# Patient Record
Sex: Male | Born: 1950 | Race: White | Hispanic: No | Marital: Married | Smoking: Never smoker
Health system: Southern US, Community
[De-identification: ages and names within clinical notes are randomized; demographics above are authoritative.]

## PROBLEM LIST (undated history)

## (undated) DIAGNOSIS — I2510 Atherosclerotic heart disease of native coronary artery without angina pectoris: Secondary | ICD-10-CM

## (undated) DIAGNOSIS — E785 Hyperlipidemia, unspecified: Secondary | ICD-10-CM

## (undated) DIAGNOSIS — Z951 Presence of aortocoronary bypass graft: Secondary | ICD-10-CM

## (undated) DIAGNOSIS — I10 Essential (primary) hypertension: Secondary | ICD-10-CM

## (undated) DIAGNOSIS — R9439 Abnormal result of other cardiovascular function study: Secondary | ICD-10-CM

## (undated) DIAGNOSIS — I219 Acute myocardial infarction, unspecified: Secondary | ICD-10-CM

## (undated) HISTORY — DX: Abnormal result of other cardiovascular function study: R94.39

## (undated) HISTORY — DX: Essential (primary) hypertension: I10

## (undated) HISTORY — DX: Atherosclerotic heart disease of native coronary artery without angina pectoris: I25.10

## (undated) HISTORY — DX: Hyperlipidemia, unspecified: E78.5

---

## 1999-04-02 ENCOUNTER — Ambulatory Visit (HOSPITAL_COMMUNITY): Admission: RE | Admit: 1999-04-02 | Discharge: 1999-04-02 | Payer: Self-pay | Admitting: Cardiology

## 1999-05-02 ENCOUNTER — Encounter: Payer: Self-pay | Admitting: Thoracic Surgery (Cardiothoracic Vascular Surgery)

## 1999-05-06 ENCOUNTER — Inpatient Hospital Stay (HOSPITAL_COMMUNITY)
Admission: RE | Admit: 1999-05-06 | Discharge: 1999-05-11 | Payer: Self-pay | Admitting: Thoracic Surgery (Cardiothoracic Vascular Surgery)

## 1999-05-06 ENCOUNTER — Encounter: Payer: Self-pay | Admitting: Thoracic Surgery (Cardiothoracic Vascular Surgery)

## 1999-05-06 HISTORY — PX: CORONARY ARTERY BYPASS GRAFT: SHX141

## 1999-05-07 ENCOUNTER — Encounter: Payer: Self-pay | Admitting: Thoracic Surgery (Cardiothoracic Vascular Surgery)

## 1999-05-08 ENCOUNTER — Encounter: Payer: Self-pay | Admitting: Thoracic Surgery (Cardiothoracic Vascular Surgery)

## 1999-05-10 ENCOUNTER — Encounter: Payer: Self-pay | Admitting: Thoracic Surgery (Cardiothoracic Vascular Surgery)

## 2003-04-03 ENCOUNTER — Ambulatory Visit (HOSPITAL_COMMUNITY): Admission: RE | Admit: 2003-04-03 | Discharge: 2003-04-03 | Payer: Self-pay | Admitting: *Deleted

## 2004-04-20 DIAGNOSIS — R9439 Abnormal result of other cardiovascular function study: Secondary | ICD-10-CM

## 2004-04-20 HISTORY — DX: Abnormal result of other cardiovascular function study: R94.39

## 2004-12-05 ENCOUNTER — Ambulatory Visit (HOSPITAL_COMMUNITY): Admission: RE | Admit: 2004-12-05 | Discharge: 2004-12-06 | Payer: Self-pay | Admitting: *Deleted

## 2004-12-05 HISTORY — PX: CORONARY ANGIOPLASTY WITH STENT PLACEMENT: SHX49

## 2006-03-29 ENCOUNTER — Ambulatory Visit (HOSPITAL_COMMUNITY): Admission: RE | Admit: 2006-03-29 | Discharge: 2006-03-29 | Payer: Self-pay | Admitting: *Deleted

## 2011-09-03 ENCOUNTER — Other Ambulatory Visit: Payer: Self-pay | Admitting: Internal Medicine

## 2011-09-11 ENCOUNTER — Ambulatory Visit
Admission: RE | Admit: 2011-09-11 | Discharge: 2011-09-11 | Disposition: A | Discharge status: Home / Self Care | Payer: 59 | Source: Ambulatory Visit | Attending: Internal Medicine | Admitting: Internal Medicine

## 2012-04-29 ENCOUNTER — Other Ambulatory Visit (HOSPITAL_COMMUNITY): Payer: Self-pay | Admitting: Cardiovascular Disease

## 2012-04-29 DIAGNOSIS — I2510 Atherosclerotic heart disease of native coronary artery without angina pectoris: Secondary | ICD-10-CM

## 2012-05-03 ENCOUNTER — Encounter: Payer: Self-pay | Admitting: *Deleted

## 2012-05-11 ENCOUNTER — Inpatient Hospital Stay (HOSPITAL_COMMUNITY): Admission: RE | Admit: 2012-05-11 | Payer: 59 | Source: Ambulatory Visit

## 2012-10-03 ENCOUNTER — Other Ambulatory Visit: Payer: Self-pay | Admitting: *Deleted

## 2012-10-03 MED ORDER — METOPROLOL TARTRATE 50 MG PO TABS: 75.0000 mg | ORAL_TABLET | Freq: Two times a day (BID) | ORAL | Status: DC

## 2012-11-01 ENCOUNTER — Other Ambulatory Visit: Payer: Self-pay | Admitting: *Deleted

## 2012-11-24 ENCOUNTER — Encounter: Payer: Self-pay | Admitting: Cardiovascular Disease

## 2012-11-24 ENCOUNTER — Ambulatory Visit (INDEPENDENT_AMBULATORY_CARE_PROVIDER_SITE_OTHER): Payer: 59 | Admitting: Cardiovascular Disease

## 2012-11-24 VITALS — BP 146/88 | HR 63 | Ht 69.0 in | Wt 180.8 lb

## 2012-11-24 DIAGNOSIS — I2510 Atherosclerotic heart disease of native coronary artery without angina pectoris: Secondary | ICD-10-CM

## 2012-11-24 DIAGNOSIS — E785 Hyperlipidemia, unspecified: Secondary | ICD-10-CM

## 2012-11-24 DIAGNOSIS — Z951 Presence of aortocoronary bypass graft: Secondary | ICD-10-CM | POA: Insufficient documentation

## 2012-11-24 DIAGNOSIS — I10 Essential (primary) hypertension: Secondary | ICD-10-CM

## 2012-11-24 NOTE — Assessment & Plan Note (Signed)
Status post coronary artery bypass grafting January 2001. He underwent cardiac catheterization by Dr. Charolette Child revealing patent grafts with the nongrafted RCA and high-grade RCA lesion which was stented by Dr. Lajuana Ripple with a drug-eluting stent (2.5 mm x 13 mm) 12/05/04. His last stress test was performed 05/16/10. He denies chest pain or shortness of breath. Since it's been almost 13 years since his bypass graft operation and close to 3 years since his last functional study am going to repeat his Myoview stress test to evaluate the status of his grafts.

## 2012-11-24 NOTE — Assessment & Plan Note (Signed)
Well-controlled on current medications 

## 2012-11-24 NOTE — Patient Instructions (Addendum)
  We will see you back in follow up in 1 year with Dr Berry  Dr Berry has ordered an exercise myoview   

## 2012-11-24 NOTE — Assessment & Plan Note (Signed)
On statin therapy. Followed by his primary care physician

## 2012-11-24 NOTE — Progress Notes (Signed)
11/24/2012 Kenneth Sanchez   1951/01/22  [id number]  Primary Physician Londell Moh, MD Primary Cardiologist: Runell Gess MD [name]   HPI:  The patient is a very pleasant 62 year old mildly overweight married Caucasian male, father of 3, grandfather to 1 grandchild, who works as a Estate agent at [employer]. I saw him a year ago. He has a history of CAD, status post coronary artery bypass grafting x4 in January 2001. He apparently had an abnormal Bruce-protocol exercise stress test with nonsustained ventricular tachycardia and was catheterized by Dr. Charolette Child revealing patent grafts with an ungrafted RCA and a high-grade RCA lesion, which was stented by Dr. Lenise Herald with a drug-eluting stent (2.5 x 13 mm) December 05, 2004. His other problems include hypertension and hyperlipidemia. He does complain of some abdominal discomfort radiating to his back but denies chest pain or shortness of breath. His most recent lab work, performed in May,wwas apparently favorable but we are in the process of obtaining those results. Since I saw him a year ago he denies chest pain or shortness of breath. I had wanted to get a Myoview stress test which was apparently denied by his insurance company. He does have full 39 -year-old grafts and therefore I'm going to obtain a  Myoview study to assess their status.   Current Outpatient Prescriptions  Medication Sig Dispense Refill  . aspirin 81 MG tablet Take 81 mg by mouth daily.      [name] atorvastatin (LIPITOR) 40 MG tablet Take 40 mg by mouth daily.      . clopidogrel (PLAVIX) 75 MG tablet Take 75 mg by mouth daily.      . fish oil-omega-3 fatty acids 1000 MG capsule Take 2,400 mg by mouth daily.       . folic acid (FOLVITE) 400 MCG tablet Take 400 mcg by mouth daily.      . metoprolol (LOPRESSOR) 50 MG tablet Take 1.5 tablets (75 mg total) by mouth 2 (two) times daily. Take 1 1/2 (one and one half) tablets twice a day  90  tablet  3  . Multiple Vitamin (MULTIVITAMIN) capsule Take 1 capsule by mouth daily.      . niacin (NIASPAN) 1000 MG CR tablet Take 2,000 mg by mouth at bedtime.      . pantoprazole (PROTONIX) 40 MG tablet Take 40 mg by mouth daily.      [name] NITROSTAT 0.4 MG SL tablet Take 0.4 mg by mouth as needed.       No current facility-administered medications for this visit.    No Known Allergies  History   Social History  . Marital Status: Married    Spouse Name: N/A    Number of Children: 3  . 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 Smoking status: Never Smoker   . Smokeless tobacco: Not on file  . Alcohol Use: Not on file  . Drug Use: Not on file  . Sexually Active: Not on file   Other Topics Concern  . Not on file   Social History Narrative  . No narrative on file     Review of Systems: General: negative for chills, fever, night sweats or weight changes.  Cardiovascular: negative for chest pain, dyspnea on exertion, edema, orthopnea, palpitations, paroxysmal nocturnal dyspnea or shortness of breath Dermatological: negative for rash Respiratory: negative for cough or wheezing Urologic: negative for hematuria Abdominal:  negative for nausea, vomiting, diarrhea, bright red blood per rectum, melena, or hematemesis Neurologic: negative for visual changes, syncope, or dizziness All other systems reviewed and are otherwise negative except as noted above.    Blood pressure 146/88, pulse 63, height 5\' 9"  (1.753 m), weight 180 lb 12.8 oz (82.01 kg).  General appearance: alert and no distress Neck: no adenopathy, no carotid bruit, no JVD, supple, symmetrical, trachea midline and thyroid not enlarged, symmetric, no tenderness/mass/nodules Lungs: clear to auscultation bilaterally Heart: regular rate and rhythm, S1, S2 normal, no murmur, click, rub or gallop Extremities: extremities normal, atraumatic, no cyanosis  or edema  EKG normal sinus rhythm at 63 without ST or T wave changes  ASSESSMENT AND PLAN:   Coronary artery disease Status post coronary artery bypass grafting January 2001. He underwent cardiac catheterization by Dr. Charolette Child revealing patent grafts with the nongrafted RCA and high-grade RCA lesion which was stented by Dr. Lajuana Ripple with a drug-eluting stent (2.5 mm x 13 mm) 12/05/04. His last stress test was performed 05/16/10. He denies chest pain or shortness of breath. Since it's been almost 13 years since his bypass graft operation and close to 3 years since his last functional study am going to repeat his Myoview stress test to evaluate the status of his grafts.  Hyperlipidemia On statin therapy. Followed by his primary care physician  Essential hypertension Well-controlled on current medications      Runell Gess MD [facility], [facility] 11/24/2012 9:21 AM

## 2012-12-13 ENCOUNTER — Ambulatory Visit (HOSPITAL_COMMUNITY)
Admission: RE | Admit: 2012-12-13 | Discharge: 2012-12-13 | Disposition: A | Discharge status: Home / Self Care | Payer: 59 | Source: Ambulatory Visit | Attending: Cardiology | Admitting: Cardiology

## 2012-12-13 DIAGNOSIS — I2510 Atherosclerotic heart disease of native coronary artery without angina pectoris: Secondary | ICD-10-CM

## 2012-12-13 MED ORDER — TECHNETIUM TC 99M SESTAMIBI GENERIC - CARDIOLITE
30.0000 | Freq: Once | INTRAVENOUS | Status: AC | PRN
  Administered 2012-12-13: 30 via INTRAVENOUS

## 2012-12-13 MED ORDER — TECHNETIUM TC 99M SESTAMIBI GENERIC - CARDIOLITE
10.0000 | Freq: Once | INTRAVENOUS | Status: AC | PRN
  Administered 2012-12-13: 10 via INTRAVENOUS

## 2012-12-13 NOTE — Procedures (Addendum)
[facility] [address] [place] [address] [phone]  Cardiology Nuclear Med Study  Kenneth Sanchez is a 62 y.o. male     MRN : [id number]     DOB: 1950-05-04  Procedure Date: 12/13/2012  Nuclear Med Background Indication for Stress Test:  Graft Patency and Stent Patency History:  CAD;CABG X4-04/1999;STENT/PTCA--12/05/2004 Cardiac Risk Factors: Family History - CAD, Hypertension, Lipids and Overweight  Symptoms:  Chest Pain   Nuclear Pre-Procedure Caffeine/Decaff Intake:  7:00pm NPO After: 5:00am   IV Site: R Hand  IV 0.9% NS with Angio Cath:  22g  Chest Size (in):  40"  IV Started by: Emmit Pomfret, RN  Height: 5\' 9"  (1.753 m)  Cup Size: n/a  BMI:  Body mass index is 26.57 kg/(m^2). Weight:  180 lb (81.647 kg)   Tech Comments:  N/A    Nuclear Med Study 1 or 2 day study: 1 day  Stress Test Type:  Stress  Order Authorizing Provider:  Nanetta Batty, MD   Resting Radionuclide: Technetium 18m Sestamibi  Resting Radionuclide Dose: 9.8 mCi   Stress Radionuclide:  Technetium 67m Sestamibi  Stress Radionuclide Dose: 30.0 mCi           Stress Protocol Rest HR: 133 Stress HR:  153  Rest BP:  188/99 Stress BP:  194/105  Exercise Time (min): 6 METS: 7.0   Predicted Max HR: 160 bpm % Max HR: 95.62 bpm Rate Pressure Product: 46962  Dose of Adenosine (mg):  n/a Dose of Lexiscan: n/a mg  Dose of Atropine (mg): n/a Dose of Dobutamine: n/a mcg/kg/min (at max HR)  Stress Test Technologist: Esperanza Sheets, CCT Nuclear Technologist: Koren Shiver, CNMT   Rest Procedure:  Myocardial perfusion imaging was performed at rest 45 minutes following the intravenous administration of Technetium 66m Sestamibi. Stress Procedure:  The patient performed treadmill exercise using a Bruce  Protocol for 6 minutes. The patient stopped due to Achieving target HR and denied any chest pain.  There were no significant ST-T wave changes.   Technetium 27m Sestamibi was injected at peak exercise and myocardial perfusion imaging was performed after a brief delay.  Transient Ischemic Dilatation (Normal <1.22):  0.85 Lung/Heart Ratio (Normal <0.45):  0.33 QGS EDV:  73 ml QGS ESV:  22 ml LV Ejection Fraction: 71%  Rest ECG: NSR - Normal EKG  Stress ECG: No significant ST segment change suggestive of ischemia.  QPS Raw Data Images:  Normal; no motion artifact; normal heart/lung ratio. Stress Images:  Normal homogeneous uptake in all areas of the myocardium. Rest Images:  Normal homogeneous uptake in all areas of the myocardium. Subtraction (SDS):  No evidence of ischemia.  Impression Exercise Capacity:  Fair exercise capacity. BP Response:  Hypotensive blood pressure response. Clinical Symptoms:  No significant symptoms noted. ECG Impression:  There are scattered PVCs. Comparison with Prior Nuclear Study: No significant change from previous study  Overall Impression:  Normal stress nuclear study.  LV Wall Motion:  NL LV Function; NL Wall Motion  Chrystie Nose, MD, [address] [facility] Board Certified in Nuclear Cardiology Attending Cardiologist [facility]  Chrystie Nose, MD  12/13/2012 10:46 AM

## 2012-12-15 ENCOUNTER — Encounter: Payer: Self-pay | Admitting: *Deleted

## 2013-02-02 ENCOUNTER — Other Ambulatory Visit: Payer: Self-pay | Admitting: Cardiovascular Disease

## 2013-02-02 NOTE — Telephone Encounter (Signed)
Rx was sent to pharmacy electronically. 

## 2013-03-04 ENCOUNTER — Other Ambulatory Visit: Payer: Self-pay | Admitting: Cardiovascular Disease

## 2013-08-01 ENCOUNTER — Telehealth: Payer: Self-pay | Admitting: *Deleted

## 2013-08-01 NOTE — Telephone Encounter (Signed)
Received fax requesting clearance to hold Plavix prior to dental extraction from Dr Filbert Bertholdobert Knox.  Dr Allyson SabalBerry reviewed the chart and gave permission to hold Plavix.  Form filled out and faxed back to Dr Lucky CowboyKnox

## 2013-12-07 ENCOUNTER — Ambulatory Visit: Payer: 59 | Admitting: Cardiovascular Disease

## 2013-12-29 ENCOUNTER — Ambulatory Visit (INDEPENDENT_AMBULATORY_CARE_PROVIDER_SITE_OTHER): Payer: Managed Care, Other (non HMO) | Admitting: Family Medicine

## 2013-12-29 VITALS — BP 126/76 | HR 64 | Temp 98.0°F | Resp 17 | Ht 69.0 in | Wt 188.0 lb

## 2013-12-29 DIAGNOSIS — M79645 Pain in left finger(s): Secondary | ICD-10-CM

## 2013-12-29 DIAGNOSIS — S61211A Laceration without foreign body of left index finger without damage to nail, initial encounter: Secondary | ICD-10-CM

## 2013-12-29 DIAGNOSIS — S61209A Unspecified open wound of unspecified finger without damage to nail, initial encounter: Secondary | ICD-10-CM

## 2013-12-29 DIAGNOSIS — M79609 Pain in unspecified limb: Secondary | ICD-10-CM

## 2013-12-29 NOTE — Progress Notes (Signed)
This is a 63 year old gentleman who comes in with his wife because he cut his left index finger on a hedge saw. This occurred this morning. He says he's up-to-date on his immunizations.  Patient has minimal pain but he was having trouble getting the bleeding to stop.   Objective: Left index finger has full range of motion. There is a laceration at the PIP joint is diagonal and full-thickness, measuring 1.2 cm. No numbness to the fingertip were distal to the laceration. He has full range of motion of the finger.  Assessment: Simple of thickness laceration of left index finger  Procedure note- VCO, patient denies allergies. Lidocaine 1% 3cc used for 2nd metacarpal block. Laceration cleaned with soap and water. 3cm laceration explored, no visible tendon. Horizontal mattress sutures x 2 placed. Wound care instructions provided. Return for removal in 10 days.   Signed, Sheila Oats.D.

## 2013-12-29 NOTE — Patient Instructions (Signed)
Sutured Wound Care °Sutures are stitches that can be used to close wounds. Wound care helps prevent pain and infection.  °HOME CARE INSTRUCTIONS  °· Rest and elevate the injured area until all the pain and swelling are gone. °· Only take over-the-counter or prescription medicines for pain, discomfort, or fever as directed by your caregiver. °· After 48 hours, gently wash the area with mild soap and water once a day, or as directed. Rinse off the soap. Pat the area dry with a clean towel. Do not rub the wound. This may cause bleeding. °· Follow your caregiver's instructions for how often to change the bandage (dressing). Stop using a dressing after 2 days or after the wound stops draining. °· If the dressing sticks, moisten it with soapy water and gently remove it. °· Apply ointment on the wound as directed. °· Avoid stretching a sutured wound. °· Drink enough fluids to keep your urine clear or pale yellow. °· Follow up with your caregiver for suture removal as directed. °· Use sunscreen on your wound for the next 3 to 6 months so the scar will not darken. °SEEK IMMEDIATE MEDICAL CARE IF:  °· Your wound becomes red, swollen, hot, or tender. °· You have increasing pain in the wound. °· You have a red streak that extends from the wound. °· There is pus coming from the wound. °· You have a fever. °· You have shaking chills. °· There is a bad smell coming from the wound. °· You have persistent bleeding from the wound. °MAKE SURE YOU:  °· Understand these instructions. °· Will watch your condition. °· Will get help right away if you are not doing well or get worse. °Document Released: 05/14/2004 Document Revised: 06/29/2011 Document Reviewed: 08/10/2010 °ExitCare® Patient Information ©2015 [employer]. This information is not intended to replace advice given to you by your health care provider. Make sure you discuss any questions you have with your health care provider. ° °

## 2014-01-08 ENCOUNTER — Ambulatory Visit (INDEPENDENT_AMBULATORY_CARE_PROVIDER_SITE_OTHER): Payer: Managed Care, Other (non HMO) | Admitting: Physician Assistant

## 2014-01-08 VITALS — BP 122/88 | HR 62 | Temp 97.8°F | Resp 18 | Ht 69.0 in | Wt 188.0 lb

## 2014-01-08 DIAGNOSIS — Z5189 Encounter for other specified aftercare: Secondary | ICD-10-CM

## 2014-01-08 DIAGNOSIS — S61211D Laceration without foreign body of left index finger without damage to nail, subsequent encounter: Secondary | ICD-10-CM

## 2014-01-08 NOTE — Patient Instructions (Signed)
Wash the wound daily with soap and water until completely healed.

## 2014-01-08 NOTE — Progress Notes (Signed)
   Subjective:    Patient ID: Kenneth Sanchez, male    DOB: 10-14-1950, 63 y.o.   MRN: [id number]   PCP: Londell Moh, MD  Chief Complaint  Patient presents with  . Suture / Staple Removal    HPI  Presents for removal of stitches placed 12/29/2013 to close a wound to the LEFT index finger. He's doing well without pain, swelling redness or drainage.  Review of Systems     Objective:   Physical Exam  BP 122/88  Pulse 62  Temp(Src) 97.8 F (36.6 C) (Oral)  Resp 18  Ht  (1.753 m)  Wt 188 lb (85.276 kg)  BMI 27.75 kg/m2  SpO2 97% WDWNWM, A&O x 3. #2 HM sutures intact. Wound is well healed. Sutures removed without incident.      Assessment & Plan:  1. Laceration of left index finger w/o foreign body w/o damage to nail, subsequent encounter Suture removal without incident.   Fernande Bras, PA-C Physician Assistant-Certified [facility]

## 2014-01-17 ENCOUNTER — Ambulatory Visit (INDEPENDENT_AMBULATORY_CARE_PROVIDER_SITE_OTHER): Payer: Managed Care, Other (non HMO) | Admitting: Cardiovascular Disease

## 2014-01-17 ENCOUNTER — Encounter: Payer: Self-pay | Admitting: Cardiovascular Disease

## 2014-01-17 VITALS — BP 134/90 | HR 61 | Ht 69.0 in | Wt 188.1 lb

## 2014-01-17 DIAGNOSIS — I2510 Atherosclerotic heart disease of native coronary artery without angina pectoris: Secondary | ICD-10-CM

## 2014-01-17 DIAGNOSIS — I10 Essential (primary) hypertension: Secondary | ICD-10-CM

## 2014-01-17 DIAGNOSIS — E785 Hyperlipidemia, unspecified: Secondary | ICD-10-CM

## 2014-01-17 NOTE — Patient Instructions (Signed)
Your physician wants you to follow-up in: 1 year with Dr Berry. You will receive a reminder letter in the mail two months in advance. If you don't receive a letter, please call our office to schedule the follow-up appointment.  

## 2014-01-17 NOTE — Assessment & Plan Note (Signed)
Controlled on current medications 

## 2014-01-17 NOTE — Assessment & Plan Note (Signed)
On statin therapy followed by his PCP. Recent blood work performed 10/03/13 revealed a total cholesterol of 114, LDL of 50 and HDL of 53.

## 2014-01-17 NOTE — Assessment & Plan Note (Signed)
History of CAD status post coronary artery bypass grafting times 04/24/1999 after abnormal Bruce protocol exercise stress test [facility] by nonsustained ventricular tachycardia. He had a stent placed in his arm graft to the RCA by Dr. Jenne CampusMcQueen 12/05/04 (2.5 mm x 13 mm). Myoview stress test performed 12/13/12 was nonischemic. He denies chest pain or shortness of breath.

## 2014-01-17 NOTE — Progress Notes (Signed)
01/17/2014 Kenneth BridgemanStanley J Craney   1950-05-18  [id number]  Primary Physician Londell MohPHARR,WALTER DAVIDSON, MD Primary Cardiologist: Runell GessJonathan J. Cesiah Westley MD Roseanne RenoFACP,FACC,FAHA, FSCAI   HPI:  The patient is a very pleasant 63 year old mildly overweight married Caucasian male, father of 3, grandfather to 3 grandchild, who works at  at [employer]. I saw him a year ago. He has a history of CAD, status post coronary artery bypass grafting x4 in January 2001. He apparently had an abnormal Bruce-protocol exercise stress test with nonsustained ventricular tachycardia and was catheterized by Dr. Charolette ChildJohn Tysinger revealing patent grafts with an ungrafted RCA and a high-grade RCA lesion, which was stented by Dr. Lenise Heraldobert McQueen with a drug-eluting stent (2.5 x 13 mm) December 05, 2004. His other problems include hypertension and hyperlipidemia. He does complain of some abdominal discomfort radiating to his back but denies chest pain or shortness of breath. His most recent lab work, performed in May,wwas apparently favorable but we are in the process of obtaining those results. I obtain a Myoview stress test on him last August which was normal.Since I saw him a year ago he denies chest pain or shortness of breath. Recent blood work performed by his primary care physician 10/03/13 revealed a total cholesterol of 114, LDL 50 and HDL of 53.  Current Outpatient Prescriptions  Medication Sig Dispense Refill  . aspirin 81 MG tablet Take 81 mg by mouth daily.      [name]. atorvastatin (LIPITOR) 40 MG tablet Take 40 mg by mouth daily.      . clopidogrel (PLAVIX) 75 MG tablet Take 75 mg by mouth daily.      . fish oil-omega-3 fatty acids 1000 MG capsule Take 2,400 mg by mouth daily.       . folic acid (FOLVITE) 400 MCG tablet Take 400 mcg by mouth daily.      . metoprolol (LOPRESSOR) 50 MG tablet TAKE 1 & 1/2 (ONE AND ONE- HALF) TABLETS BY MOUTH TWICE A DAY AS DIRECTED  90 tablet  8  . metoprolol (LOPRESSOR) 50 MG tablet TAKE 1 & 1/2 (ONE  AND ONE- HALF) TABLETS BY MOUTH TWICE A DAY AS DIRECTED  90 tablet  6  . Multiple Vitamin (MULTIVITAMIN) capsule Take 1 capsule by mouth daily.      . niacin (NIASPAN) 1000 MG CR tablet Take 2,000 mg by mouth at bedtime.      [name]. NITROSTAT 0.4 MG SL tablet Take 0.4 mg by mouth as needed.      . pantoprazole (PROTONIX) 40 MG tablet Take 40 mg by mouth daily.       No current facility-administered medications for this visit.    No Known Allergies  History   Social History  . Marital Status: Married    Spouse Name: N/A    Number of Children: 3  . 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 Smoking status: Never Smoker   . Smokeless tobacco: Not on file  . Alcohol Use: No  . Drug Use: No  . Sexual Activity: No   Other Topics Concern  . Not on file   Social History Narrative  . No narrative on file     Review of Systems: General: negative for chills, fever, night sweats or weight changes.  Cardiovascular: negative for chest pain, dyspnea on exertion, edema, orthopnea, palpitations, paroxysmal nocturnal dyspnea or shortness of breath Dermatological: negative for rash Respiratory:  negative for cough or wheezing Urologic: negative for hematuria Abdominal: negative for nausea, vomiting, diarrhea, bright red blood per rectum, melena, or hematemesis Neurologic: negative for visual changes, syncope, or dizziness All other systems reviewed and are otherwise negative except as noted above.    Blood pressure 134/90, pulse 61, height 5\' 9"  (1.753 m), weight 188 lb 1.6 oz (85.322 kg).  General appearance: alert and no distress Neck: no adenopathy, no carotid bruit, no JVD, supple, symmetrical, trachea midline and thyroid not enlarged, symmetric, no tenderness/mass/nodules Lungs: clear to auscultation bilaterally Heart: regular rate and rhythm, S1, S2 normal, no murmur, click, rub or gallop Extremities:  extremities normal, atraumatic, no cyanosis or edema  EKG normal sinus rhythm at 61 without ST or T wave changes  ASSESSMENT AND PLAN:   Essential hypertension Controlled on current medications  Hyperlipidemia On statin therapy followed by his PCP. Recent blood work performed 10/03/13 revealed a total cholesterol of 114, LDL of 50 and HDL of 53.  Coronary artery disease History of CAD status post coronary artery bypass grafting times 04/24/1999 after abnormal Bruce protocol exercise stress test [facility] by nonsustained ventricular tachycardia. He had a stent placed in his arm graft to the RCA by Dr. Jenne Campus 12/05/04 (2.5 mm x 13 mm). Myoview stress test performed 12/13/12 was nonischemic. He denies chest pain or shortness of breath.      Runell Gess MD FACP,FACC,FAHA, [facility] 01/17/2014 9:59 AM

## 2014-03-23 ENCOUNTER — Other Ambulatory Visit: Payer: Self-pay | Admitting: Cardiovascular Disease

## 2014-03-23 NOTE — Telephone Encounter (Signed)
Rx was sent to pharmacy electronically. 

## 2014-11-07 ENCOUNTER — Encounter: Payer: Self-pay | Admitting: Cardiovascular Disease

## 2014-11-16 ENCOUNTER — Encounter: Payer: Self-pay | Admitting: *Deleted

## 2014-12-14 ENCOUNTER — Other Ambulatory Visit: Payer: Self-pay | Admitting: Cardiovascular Disease

## 2014-12-14 NOTE — Telephone Encounter (Signed)
Rx request sent to pharmacy.  

## 2015-01-02 ENCOUNTER — Encounter: Payer: Self-pay | Admitting: Cardiovascular Disease

## 2015-01-08 ENCOUNTER — Ambulatory Visit: Payer: Managed Care, Other (non HMO) | Admitting: Cardiovascular Disease

## 2015-01-08 ENCOUNTER — Encounter: Payer: Self-pay | Admitting: Cardiovascular Disease

## 2015-01-08 ENCOUNTER — Ambulatory Visit (INDEPENDENT_AMBULATORY_CARE_PROVIDER_SITE_OTHER): Payer: Managed Care, Other (non HMO) | Admitting: Cardiovascular Disease

## 2015-01-08 VITALS — BP 142/82 | HR 59 | Ht 69.0 in | Wt 177.5 lb

## 2015-01-08 DIAGNOSIS — E785 Hyperlipidemia, unspecified: Secondary | ICD-10-CM

## 2015-01-08 DIAGNOSIS — I2510 Atherosclerotic heart disease of native coronary artery without angina pectoris: Secondary | ICD-10-CM

## 2015-01-08 DIAGNOSIS — I10 Essential (primary) hypertension: Secondary | ICD-10-CM

## 2015-01-08 DIAGNOSIS — I2583 Coronary atherosclerosis due to lipid rich plaque: Principal | ICD-10-CM

## 2015-01-08 NOTE — Assessment & Plan Note (Signed)
History of hypertension with blood pressure measured at 142/82. He is on metoprolol. Continue current meds at current dosing

## 2015-01-08 NOTE — Progress Notes (Signed)
01/08/2015 Kenneth Sanchez   06/27/1950  [id number]  Primary Physician Londell Moh, MD Primary Cardiologist: Runell Gess MD [name]   HPI:  The patient is a very pleasant 64 year old mildly overweight married Caucasian male, father of 3, grandfather to 3 grandchild, who works at at [employer]. I saw him a year ago. He has a history of CAD, status post coronary artery bypass grafting x4 in January 2001. He apparently had an abnormal Bruce-protocol exercise stress test with nonsustained ventricular tachycardia and was catheterized by Dr. Charolette Child revealing patent grafts with an ungrafted RCA and a high-grade RCA lesion, which was stented by Dr. Lenise Herald with a drug-eluting stent (2.5 x 13 mm) December 05, 2004. His other problems include hypertension and hyperlipidemia. He does complain of some abdominal discomfort radiating to his back but denies chest pain or shortness of breath. His most recent lab work, performed in May,wwas apparently favorable but we are in the process of obtaining those results. I obtain a Myoview stress test on him August 2014 which was normal.Since I saw him a year ago he denies chest pain or shortness of breath.    Current Outpatient Prescriptions  Medication Sig Dispense Refill  . aspirin 81 MG tablet Take 81 mg by mouth daily.    [name] atorvastatin (LIPITOR) 40 MG tablet Take 40 mg by mouth daily.    . clopidogrel (PLAVIX) 75 MG tablet Take 75 mg by mouth daily.    . folic acid (FOLVITE) 400 MCG tablet Take 400 mcg by mouth daily.    . metoprolol (LOPRESSOR) 50 MG tablet TAKE 1 & 1/2 (ONE AND ONE- HALF) TABLETS BY MOUTH TWICE A DAY AS DIRECTED 90 tablet 0  . Multiple Vitamin (MULTIVITAMIN) capsule Take 1 capsule by mouth daily.    . niacin (NIASPAN) 1000 MG CR tablet Take 2,000 mg by mouth at bedtime.    [name] NITROSTAT 0.4 MG SL tablet Take 0.4 mg by mouth every 5 (five) minutes as needed for chest pain (take up to 3 times  daily).     . Omega-3 Fatty Acids (FISH OIL) 600 MG CAPS Take 1,200 mg by mouth daily.     No current facility-administered medications for this visit.    No Known Allergies  Social History   Social History  . Marital Status: Married    Spouse Name: N/A  . Number of Children: 3  . 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 Smoking status: Never Smoker   . Smokeless tobacco: Not on file  . Alcohol Use: No  . Drug Use: No  . Sexual Activity: No   Other Topics Concern  . Not on file   Social History Narrative     Review of Systems: General: negative for chills, fever, night sweats or weight changes.  Cardiovascular: negative for chest pain, dyspnea on exertion, edema, orthopnea, palpitations, paroxysmal nocturnal dyspnea or shortness of breath Dermatological: negative for rash Respiratory: negative for cough or wheezing Urologic: negative for hematuria Abdominal: negative for nausea, vomiting, diarrhea, bright red blood per rectum, melena, or hematemesis Neurologic: negative for visual changes, syncope, or dizziness All other systems reviewed and are otherwise negative except as noted above.    Blood pressure 142/82, pulse 59, height  (1.753 m), weight 177 lb 8 oz (80.513 kg).  General appearance: alert and no distress Neck: no adenopathy, no carotid bruit,  no JVD, supple, symmetrical, trachea midline and thyroid not enlarged, symmetric, no tenderness/mass/nodules Lungs: clear to auscultation bilaterally Heart: regular rate and rhythm, S1, S2 normal, no murmur, click, rub or gallop Extremities: extremities normal, atraumatic, no cyanosis or edema Pulses: 2+ and symmetric Skin: Skin color, texture, turgor normal. No rashes or lesions Neurologic: Grossly normal  EKG sinus bradycardia 59 without ST or T-wave changes. I personally reviewed this EKG  ASSESSMENT AND PLAN:    Hyperlipidemia History of hyperlipidemia or atorvastatin 40 mg a day and fish oil followed by his PCP  Essential hypertension History of hypertension with blood pressure measured at 142/82. He is on metoprolol. Continue current meds at current dosing  Coronary artery disease History of coronary artery disease status post coronary artery bypass grafting January 2001 with 4 bypass grafts. He underwent mid native RCA intervention by Dr. Jenne Campus with a drug-eluting stent 12/05/04. He had a Myoview stress test performed August 2014 which was nonischemic. He denies chest pain or shortness of breath.      Runell Gess MD FACP,FACC,FAHA, [facility] 01/08/2015 9:26 AM

## 2015-01-08 NOTE — Patient Instructions (Signed)
Medication Instructions:  Your physician recommends that you continue on your current medications as directed. Please refer to the Current Medication list given to you today.   Labwork: NONE  Testing/Procedures: NONE  Follow-Up: Your physician wants you to follow-up in: 12 months with Dr. Berry. You will receive a reminder letter in the mail two months in advance. If you don't receive a letter, please call our office to schedule the follow-up appointment.   Any Other Special Instructions Will Be Listed Below (If Applicable).   

## 2015-01-08 NOTE — Assessment & Plan Note (Signed)
History of coronary artery disease status post coronary artery bypass grafting January 2001 with 4 bypass grafts. He underwent mid native RCA intervention by Dr. Jenne Campus with a drug-eluting stent 12/05/04. He had a Myoview stress test performed August 2014 which was nonischemic. He denies chest pain or shortness of breath.

## 2015-01-08 NOTE — Assessment & Plan Note (Signed)
History of hyperlipidemia or atorvastatin 40 mg a day and fish oil followed by his PCP

## 2015-02-09 ENCOUNTER — Other Ambulatory Visit: Payer: Self-pay | Admitting: Cardiovascular Disease

## 2015-02-11 ENCOUNTER — Other Ambulatory Visit: Payer: Self-pay | Admitting: Cardiovascular Disease

## 2015-02-19 ENCOUNTER — Ambulatory Visit: Payer: Managed Care, Other (non HMO) | Admitting: Cardiovascular Disease

## 2016-01-14 ENCOUNTER — Ambulatory Visit (INDEPENDENT_AMBULATORY_CARE_PROVIDER_SITE_OTHER): Payer: Managed Care, Other (non HMO) | Admitting: Cardiovascular Disease

## 2016-01-14 ENCOUNTER — Encounter: Payer: Self-pay | Admitting: Cardiovascular Disease

## 2016-01-14 ENCOUNTER — Telehealth: Payer: Self-pay | Admitting: Cardiovascular Disease

## 2016-01-14 DIAGNOSIS — I10 Essential (primary) hypertension: Secondary | ICD-10-CM | POA: Diagnosis not present

## 2016-01-14 DIAGNOSIS — E785 Hyperlipidemia, unspecified: Secondary | ICD-10-CM | POA: Diagnosis not present

## 2016-01-14 NOTE — Assessment & Plan Note (Signed)
History of CAD status post coronary artery bypass grafting X 24 April 1999. His RCA was stented by Dr. Jenne CampusMcQueen with a drug-eluting stent 12/05/2004. He denies chest pain or shortness of breath. Myoview stress test performed August 2014 was nonischemic.

## 2016-01-14 NOTE — Patient Instructions (Signed)
Medication Instructions:  NO CHANGES.   Follow-Up: Your physician wants you to follow-up in: 12 MONTHS WITH DR BERRY.  You will receive a reminder letter in the mail two months in advance. If you don't receive a letter, please call our office to schedule the follow-up appointment.   If you need a refill on your cardiac medications before your next appointment, please call your pharmacy.   

## 2016-01-14 NOTE — Telephone Encounter (Signed)
Pharmacies as listed in encounter added to patient's chart. Previous pharmacy removed.

## 2016-01-14 NOTE — Telephone Encounter (Signed)
Mr. Kenneth Sanchez noticed the pharmacy listed in his chart is incorrect. He'd like a correction made. I was unable to do it from my computer. For his daily medication, he uses [facility]. For any type of medication that needs to be picked up he uses [facility] on [address]. Please give him a call if you have questions or concerns.  Pt's ph# [phone]  Thank you.

## 2016-01-14 NOTE — Assessment & Plan Note (Signed)
History of hypertension with blood pressure measured today at 126/76. He is on metoprolol. Continue current meds at current dosing

## 2016-01-14 NOTE — Progress Notes (Signed)
01/14/2016 Kenneth Sanchez   20-Jan-1951  [id number]  Primary Physician Londell Moh, MD Primary Cardiologist: Runell Gess MD [name]  HPI:  The patient is a very pleasant 65 year old mildly overweight married Caucasian male, father of 3, grandfather to 3 grandchild, who works at at [employer]. I last saw him 01/08/15. He has a history of CAD, status post coronary artery bypass grafting x4 in January 2001. He apparently had an abnormal Bruce-protocol exercise stress test with nonsustained ventricular tachycardia and was catheterized by Dr. Charolette Child revealing patent grafts with an ungrafted RCA and a high-grade RCA lesion, which was stented by Dr. Lenise Herald with a drug-eluting stent (2.5 x 13 mm) December 05, 2004. His other problems include hypertension and hyperlipidemia. He does complain of some abdominal discomfort radiating to his back but denies chest pain or shortness of breath. His most recent lab work, performed in May,wwas apparently favorable but we are in the process of obtaining those results. I obtain a Myoview stress test on him August 2014 which was normal.Since I saw him a year ago he denies chest pain or shortness of breath.   Current Outpatient Prescriptions  Medication Sig Dispense Refill  . aspirin 81 MG tablet Take 81 mg by mouth daily.    [name] atorvastatin (LIPITOR) 40 MG tablet Take 40 mg by mouth daily.    . clopidogrel (PLAVIX) 75 MG tablet Take 75 mg by mouth daily.    . folic acid (FOLVITE) 400 MCG tablet Take 400 mcg by mouth daily.    . metoprolol (LOPRESSOR) 50 MG tablet TAKE 1 & 1/2 (ONE AND ONE- HALF) TABLETS BY MOUTH TWICE A DAY AS DIRECTED 90 tablet 10  . Multiple Vitamin (MULTIVITAMIN) capsule Take 1 capsule by mouth daily.    . niacin (NIASPAN) 1000 MG CR tablet Take 2,000 mg by mouth at bedtime.    [name] NITROSTAT 0.4 MG SL tablet Take 0.4 mg by mouth every 5 (five) minutes as needed for chest pain (take up to 3 times  daily).     . Omega-3 Fatty Acids (FISH OIL) 600 MG CAPS Take 1,200 mg by mouth daily.     No current facility-administered medications for this visit.     No Known Allergies  Social History   Social History  . Marital status: Married    Spouse name: N/A  . Number of children: 3  . Years of education: N/A   Occupational History  . fork lift operator [employer]   Social History Main Topics  . Smoking status: Never Smoker  . Smokeless tobacco: Not on file  . Alcohol use No  . Drug use: No  . Sexual activity: No   Other Topics Concern  . Not on file   Social History Narrative  . No narrative on file     Review of Systems: General: negative for chills, fever, night sweats or weight changes.  Cardiovascular: negative for chest pain, dyspnea on exertion, edema, orthopnea, palpitations, paroxysmal nocturnal dyspnea or shortness of breath Dermatological: negative for rash Respiratory: negative for cough or wheezing Urologic: negative for hematuria Abdominal: negative for nausea, vomiting, diarrhea, bright red blood per rectum, melena, or hematemesis Neurologic: negative for visual changes, syncope, or dizziness All other systems reviewed and are otherwise negative except as noted above.    Blood pressure 126/76, pulse 76, height 5\' 9"  (1.753 m), weight 188 lb (85.3 kg).  General appearance: alert and no distress Neck: no adenopathy,  no carotid bruit, no JVD, supple, symmetrical, trachea midline and thyroid not enlarged, symmetric, no tenderness/mass/nodules Lungs: clear to auscultation bilaterally Heart: regular rate and rhythm, S1, S2 normal, no murmur, click, rub or gallop Extremities: extremities normal, atraumatic, no cyanosis or edema  EKG normal sinus rhythm at 62 of her ST or T-wave changes. I personally reviewed this EKG  ASSESSMENT AND PLAN:   Essential hypertension History of hypertension with blood pressure measured today at 126/76. He is on metoprolol.  Continue current meds at current dosing  Hyperlipidemia History of CAD status post coronary artery bypass grafting X 24 April 1999. His RCA was stented by Dr. Jenne CampusMcQueen with a drug-eluting stent 12/05/2004. He denies chest pain or shortness of breath. Myoview stress test performed August 2014 was nonischemic.      Runell GessJonathan J. Avari Gelles MD FACP,FACC,FAHA, [facility] 01/14/2016 2:38 PM

## 2016-03-26 ENCOUNTER — Observation Stay (HOSPITAL_COMMUNITY)
Admission: EM | Admit: 2016-03-26 | Discharge: 2016-03-27 | Disposition: A | Discharge status: Home / Self Care | Payer: Managed Care, Other (non HMO) | Attending: Internal Medicine | Admitting: Internal Medicine

## 2016-03-26 ENCOUNTER — Encounter (HOSPITAL_COMMUNITY): Payer: Self-pay | Admitting: Emergency Medicine

## 2016-03-26 ENCOUNTER — Emergency Department (HOSPITAL_COMMUNITY): Payer: Managed Care, Other (non HMO)

## 2016-03-26 ENCOUNTER — Observation Stay (HOSPITAL_BASED_OUTPATIENT_CLINIC_OR_DEPARTMENT_OTHER): Payer: Managed Care, Other (non HMO)

## 2016-03-26 DIAGNOSIS — R55 Syncope and collapse: Secondary | ICD-10-CM | POA: Diagnosis not present

## 2016-03-26 DIAGNOSIS — Z7902 Long term (current) use of antithrombotics/antiplatelets: Secondary | ICD-10-CM | POA: Diagnosis not present

## 2016-03-26 DIAGNOSIS — I2583 Coronary atherosclerosis due to lipid rich plaque: Secondary | ICD-10-CM | POA: Diagnosis not present

## 2016-03-26 DIAGNOSIS — Z79899 Other long term (current) drug therapy: Secondary | ICD-10-CM | POA: Insufficient documentation

## 2016-03-26 DIAGNOSIS — Z955 Presence of coronary angioplasty implant and graft: Secondary | ICD-10-CM | POA: Diagnosis not present

## 2016-03-26 DIAGNOSIS — K219 Gastro-esophageal reflux disease without esophagitis: Secondary | ICD-10-CM | POA: Diagnosis not present

## 2016-03-26 DIAGNOSIS — S0101XA Laceration without foreign body of scalp, initial encounter: Secondary | ICD-10-CM | POA: Insufficient documentation

## 2016-03-26 DIAGNOSIS — E785 Hyperlipidemia, unspecified: Secondary | ICD-10-CM | POA: Diagnosis present

## 2016-03-26 DIAGNOSIS — S0003XA Contusion of scalp, initial encounter: Secondary | ICD-10-CM

## 2016-03-26 DIAGNOSIS — Z7982 Long term (current) use of aspirin: Secondary | ICD-10-CM | POA: Diagnosis not present

## 2016-03-26 DIAGNOSIS — W19XXXA Unspecified fall, initial encounter: Secondary | ICD-10-CM | POA: Diagnosis not present

## 2016-03-26 DIAGNOSIS — H6123 Impacted cerumen, bilateral: Secondary | ICD-10-CM | POA: Diagnosis not present

## 2016-03-26 DIAGNOSIS — I10 Essential (primary) hypertension: Secondary | ICD-10-CM | POA: Diagnosis present

## 2016-03-26 DIAGNOSIS — Z951 Presence of aortocoronary bypass graft: Secondary | ICD-10-CM | POA: Diagnosis present

## 2016-03-26 DIAGNOSIS — I2510 Atherosclerotic heart disease of native coronary artery without angina pectoris: Secondary | ICD-10-CM | POA: Diagnosis not present

## 2016-03-26 LAB — URINALYSIS, ROUTINE W REFLEX MICROSCOPIC
Bilirubin Urine: NEGATIVE
Glucose, UA: NEGATIVE mg/dL
Hgb urine dipstick: NEGATIVE
KETONES UR: 5 mg/dL — AB
LEUKOCYTES UA: NEGATIVE
NITRITE: NEGATIVE
PH: 7 (ref 5.0–8.0)
Protein, ur: NEGATIVE mg/dL
Specific Gravity, Urine: 1.01 (ref 1.005–1.030)

## 2016-03-26 LAB — BASIC METABOLIC PANEL
ANION GAP: 7 (ref 5–15)
BUN: 17 mg/dL (ref 6–20)
CO2: 28 mmol/L (ref 22–32)
Calcium: 9.1 mg/dL (ref 8.9–10.3)
Chloride: 101 mmol/L (ref 101–111)
Creatinine, Ser: 0.92 mg/dL (ref 0.61–1.24)
Glucose, Bld: 135 mg/dL — ABNORMAL HIGH (ref 65–99)
POTASSIUM: 4.6 mmol/L (ref 3.5–5.1)
SODIUM: 136 mmol/L (ref 135–145)

## 2016-03-26 LAB — CBC
HEMATOCRIT: 46.6 % (ref 39.0–52.0)
HEMOGLOBIN: 16.4 g/dL (ref 13.0–17.0)
MCH: 33 pg (ref 26.0–34.0)
MCHC: 35.2 g/dL (ref 30.0–36.0)
MCV: 93.8 fL (ref 78.0–100.0)
Platelets: 121 10*3/uL — ABNORMAL LOW (ref 150–400)
RBC: 4.97 MIL/uL (ref 4.22–5.81)
RDW: 12.6 % (ref 11.5–15.5)
WBC: 15 10*3/uL — AB (ref 4.0–10.5)

## 2016-03-26 LAB — TROPONIN I
Troponin I: <0.03 ng/mL (ref <0.03)
Troponin I: <0.03 ng/mL (ref <0.03)

## 2016-03-26 LAB — ECHOCARDIOGRAM COMPLETE
HEIGHTINCHES: 69 in
WEIGHTICAEL: 2974.4 [oz_av]

## 2016-03-26 LAB — CBG MONITORING, ED: Glucose-Capillary: 118 mg/dL — ABNORMAL HIGH (ref 65–99)

## 2016-03-26 MED ORDER — FISH OIL 600 MG PO CAPS: 2400.0000 mg | ORAL_CAPSULE | Freq: Every day | ORAL | Status: DC

## 2016-03-26 MED ORDER — OMEGA-3-ACID ETHYL ESTERS 1 G PO CAPS
2.0000 g | ORAL_CAPSULE | Freq: Every day | ORAL | Status: DC
  Administered 2016-03-27: 2 g via ORAL
  Filled 2016-03-26: qty 2

## 2016-03-26 MED ORDER — ADULT MULTIVITAMIN W/MINERALS CH
1.0000 | ORAL_TABLET | Freq: Every day | ORAL | Status: DC
  Administered 2016-03-26 – 2016-03-27 (×2): 1 via ORAL
  Filled 2016-03-26 (×2): qty 1

## 2016-03-26 MED ORDER — LORATADINE 10 MG PO TABS
10.0000 mg | ORAL_TABLET | Freq: Every day | ORAL | Status: DC
  Administered 2016-03-26 – 2016-03-27 (×2): 10 mg via ORAL
  Filled 2016-03-26 (×2): qty 1

## 2016-03-26 MED ORDER — VITAMIN C 500 MG PO TABS
1000.0000 mg | ORAL_TABLET | Freq: Every day | ORAL | Status: DC
  Administered 2016-03-26 – 2016-03-27 (×2): 1000 mg via ORAL
  Filled 2016-03-26 (×2): qty 2

## 2016-03-26 MED ORDER — NIACIN ER 500 MG PO CPCR
2000.0000 mg | ORAL_CAPSULE | Freq: Every day | ORAL | Status: DC
  Administered 2016-03-26: 2000 mg via ORAL
  Filled 2016-03-26: qty 4

## 2016-03-26 MED ORDER — ENOXAPARIN SODIUM 40 MG/0.4ML ~~LOC~~ SOLN
40.0000 mg | SUBCUTANEOUS | Status: DC
  Administered 2016-03-26: 40 mg via SUBCUTANEOUS
  Filled 2016-03-26: qty 0.4

## 2016-03-26 MED ORDER — NIACIN ER (ANTIHYPERLIPIDEMIC) 1000 MG PO TBCR: 2000.0000 mg | EXTENDED_RELEASE_TABLET | Freq: Every day | ORAL | Status: DC

## 2016-03-26 MED ORDER — SODIUM CHLORIDE 0.9 % IV SOLN: INTRAVENOUS | Status: DC

## 2016-03-26 MED ORDER — ATORVASTATIN CALCIUM 40 MG PO TABS
40.0000 mg | ORAL_TABLET | Freq: Every day | ORAL | Status: DC
  Administered 2016-03-26: 40 mg via ORAL
  Filled 2016-03-26: qty 1

## 2016-03-26 MED ORDER — FOLIC ACID 1 MG PO TABS
500.0000 ug | ORAL_TABLET | Freq: Every evening | ORAL | Status: DC
  Administered 2016-03-26: 0.5 mg via ORAL
  Filled 2016-03-26: qty 1

## 2016-03-26 MED ORDER — TETANUS-DIPHTH-ACELL PERTUSSIS 5-2.5-18.5 LF-MCG/0.5 IM SUSP
0.5000 mL | Freq: Once | INTRAMUSCULAR | Status: AC
  Administered 2016-03-26: 0.5 mL via INTRAMUSCULAR
  Filled 2016-03-26: qty 0.5

## 2016-03-26 MED ORDER — SODIUM CHLORIDE 0.9 % IV SOLN
INTRAVENOUS | Status: DC
  Administered 2016-03-26 – 2016-03-27 (×2): via INTRAVENOUS

## 2016-03-26 MED ORDER — METOPROLOL TARTRATE 50 MG PO TABS
50.0000 mg | ORAL_TABLET | Freq: Two times a day (BID) | ORAL | Status: DC
  Administered 2016-03-26 – 2016-03-27 (×3): 50 mg via ORAL
  Filled 2016-03-26 (×3): qty 1

## 2016-03-26 MED ORDER — CLOPIDOGREL BISULFATE 75 MG PO TABS
75.0000 mg | ORAL_TABLET | Freq: Every day | ORAL | Status: DC
  Administered 2016-03-26 – 2016-03-27 (×2): 75 mg via ORAL
  Filled 2016-03-26 (×2): qty 1

## 2016-03-26 MED ORDER — SODIUM CHLORIDE 0.9% FLUSH: 3.0000 mL | Freq: Two times a day (BID) | INTRAVENOUS | Status: DC

## 2016-03-26 MED ORDER — MULTIVITAMINS PO CAPS: 1.0000 | ORAL_CAPSULE | Freq: Every day | ORAL | Status: DC

## 2016-03-26 MED ORDER — GUAIFENESIN ER 600 MG PO TB12
600.0000 mg | ORAL_TABLET | Freq: Two times a day (BID) | ORAL | Status: DC
  Administered 2016-03-26 – 2016-03-27 (×3): 600 mg via ORAL
  Filled 2016-03-26 (×3): qty 1

## 2016-03-26 MED ORDER — ASPIRIN EC 81 MG PO TBEC
81.0000 mg | DELAYED_RELEASE_TABLET | Freq: Every evening | ORAL | Status: DC
  Administered 2016-03-26: 81 mg via ORAL
  Filled 2016-03-26: qty 1

## 2016-03-26 MED ORDER — PANTOPRAZOLE SODIUM 40 MG PO TBEC
40.0000 mg | DELAYED_RELEASE_TABLET | Freq: Every day | ORAL | Status: DC
  Administered 2016-03-26 – 2016-03-27 (×2): 40 mg via ORAL
  Filled 2016-03-26 (×2): qty 1

## 2016-03-26 NOTE — ED Notes (Signed)
ED Provider at bedside. EDP LITTLE PRESENT TO UPDATE PT RESULTS WITH PT AND WIFE

## 2016-03-26 NOTE — ED Triage Notes (Addendum)
Pt c/o syncope and fall today onto tile floor at about 0545 today. Does not remember fall. Wife reports pt was trying to get up when she found him, pt does not recall this. Laceration to posterior head. No anticoagulants. Plavix and aspirin for antiplatelets. Pt reports he has had hoarseness, sore throat, cough, sneezing, sinus congestion onset Saturday. Was taking Mucinex, HPB for cold.

## 2016-03-26 NOTE — Progress Notes (Signed)
CSW spoke with patient at bedside with wife present Kenneth ManeDonna Reifschneider. Patient reports he has a cold and he does not remember the fall. Patient reports he had to use the bathroom and he got up off of the commode. Patient reports he does not remember getting up and he states next he knew he was on the couch beside his wife. Patient reports his wife informed him that he had fallen. Patient reports he fell on the kitchen tile floor. Patient reports no history of falls nor any use of medical equipment. No questions noted for CSW at this time.   Posey ReaLaVonia Pritika Alvarez, LCSWA Clinical Social Worker [phone] 11:57 AM

## 2016-03-26 NOTE — ED Notes (Signed)
Patient transported to CT 

## 2016-03-26 NOTE — ED Notes (Signed)
SOCIAL WORK PRESENT TO SPEAK WITH PT AND WIFE

## 2016-03-26 NOTE — H&P (Signed)
History and Physical        Hospital Admission Note Date: 03/26/2016  Patient name: Kenneth BridgemanStanley J Sanchez Medical record number: [id number] Date of birth: 21-Oct-1950 Age: 65 y.o. Gender: male  PCP: Londell MohPHARR,WALTER DAVIDSON, MD   Referring physician: Dr Clarene DukeLittle  Patient coming from: home    Chief Complaint:  Passed out at home   HPI: Patient is a 65 year old male with history of CAD status post CABG, hypertension, hyperlipidemia presented from home with a syncopal episode today. History was obtained from patient and his wife. The patient reported that he woke up around 5:45 AM this morning, went to the bathroom, was straining on his toilet for BM, subsequently flushed his toilet and was walking out of the bathroom. Next thing he remembers was being on the floor found by his wife. The patient's wife reported loud thud of his fall, patient struck the back of his head. Patient denied any chest pain, palpitation, dizziness or lightheadedness. Patient reported that he has been having sore throat, mild coughing, nasal congestion and URI symptoms for the last 3 days. He had been taking Mucinex, Coricidin (acetaminophen and chlorpheniramine) for his symptoms. He denied any blurry vision, focal neurological deficits. Per patient's wife, he was out for less than 2 minutes. She did not notice any seizure-like activity or focal neurological deficits. ED work-up/course:  Temp 98.3, respiratory rate 16, pulse 74, BP 155/81, orthostatic vitals negative CBC, BMET unremarkable, except WBCs 15.0 UA with ketones  CT head with no evidence of acute intracranial or cervical spine injury. CT C-spine showed large left parietal scalp hematoma with laceration, no fracture. Chest x-ray with no active cardiopulmonary disease. EKG with normal sinus rhythm, rate 75, no acute ST-T wave changes suggestive of  ischemia   Review of Systems: Positives marked in 'bold' Constitutional: Denies fever, chills, diaphoresis, poor appetite and fatigue.  HEENT: Denies photophobia, eye pain, redness, hearing loss, ear pain, congestion, sore throat, rhinorrhea, sneezing, mouth sores, trouble swallowing, neck pain, neck stiffness and tinnitus.   Respiratory: Denies SOB, DOE, cough, chest tightness,  and wheezing.   Cardiovascular: Denies chest pain, palpitations and leg swelling.  Gastrointestinal: Denies nausea, vomiting, abdominal pain, diarrhea, constipation, blood in stool and abdominal distention.  Genitourinary: Denies dysuria, urgency, frequency, hematuria, flank pain and difficulty urinating.  Musculoskeletal: Denies myalgias, back pain, joint swelling, arthralgias and gait problem.  Skin: Denies pallor, rash and wound.  Neurological: Denies seizures, weakness, numbness and headaches.  please see history of present illness  Hematological: Denies adenopathy. Easy bruising, personal or family bleeding history  Psychiatric/Behavioral: Denies suicidal ideation, mood changes, confusion, nervousness, sleep disturbance and agitation  Past Medical History: Past Medical History:  Diagnosis Date  . Abnormal stress test 2006   NSVT; had cardiac cath-12/05/04  . CAD (coronary artery disease)    CABG x4- 05/06/99; cath with PCI 12/05/2004  . Hyperlipemia   . Hypertension     Past Surgical History:  Procedure Laterality Date  . CORONARY ANGIOPLASTY WITH STENT PLACEMENT  12/05/2004   cypher stent [id number] to mid RCA, inflation to 14 atmospheres  . CORONARY ARTERY BYPASS GRAFT  05/06/99   x4; LIMA to distal LAD; RIMA to  distal RCA, SVG to first diag branch, SVG to circ marginal branch    Medications: Prior to Admission medications   Medication Sig Start Date End Date Taking? Authorizing Provider  Ascorbic Acid (VITAMIN C) 1000 MG tablet Take 1,000 mg by mouth daily.   Yes Historical Provider, MD  aspirin 81  MG tablet Take 81 mg by mouth every evening.    Yes Historical Provider, MD  atorvastatin (LIPITOR) 40 MG tablet Take 40 mg by mouth at bedtime.    Yes Historical Provider, MD  clopidogrel (PLAVIX) 75 MG tablet Take 75 mg by mouth daily.   Yes Historical Provider, MD  Dextromethorphan-Guaifenesin (CORICIDIN HBP CONGESTION/COUGH PO) Take 2 tablets by mouth every 6 (six) hours as needed (cold symptoms).   Yes Historical Provider, MD  esomeprazole (NEXIUM) 40 MG capsule Take 40 mg by mouth at bedtime.   Yes Historical Provider, MD  folic acid (FOLVITE) 400 MCG tablet Take 400 mcg by mouth every evening.    Yes Historical Provider, MD  metoprolol (LOPRESSOR) 50 MG tablet TAKE 1 & 1/2 (ONE AND ONE- HALF) TABLETS BY MOUTH TWICE A DAY AS DIRECTED Patient taking differently: TAKE 1 & 1/2 (ONE AND ONE- HALF)= 75mg  TABLETS BY MOUTH TWICE A DAY AS DIRECTED 02/11/15  Yes Runell GessJonathan J Berry, MD  Multiple Vitamin (MULTIVITAMIN) capsule Take 1 capsule by mouth daily.   Yes Historical Provider, MD  niacin (NIASPAN) 1000 MG CR tablet Take 2,000 mg by mouth at bedtime.   Yes Historical Provider, MD  NITROSTAT 0.4 MG SL tablet Take 0.4 mg by mouth every 5 (five) minutes as needed for chest pain (take up to 3 times daily).  09/13/12  Yes Historical Provider, MD  Omega-3 Fatty Acids (FISH OIL) 600 MG CAPS Take 2,400 mg by mouth daily.    Yes Historical Provider, MD  ranitidine (ZANTAC) 150 MG tablet Take 150 mg by mouth daily.   Yes Historical Provider, MD    Allergies:  No Known Allergies  Social History:  reports that he has never smoked. He does not have any smokeless tobacco history on file. He reports that he does not drink alcohol or use drugs.  Family History: Family History  Problem Relation Age of Onset  . Heart disease Mother 3160  . COPD Father     Physical Exam: Blood pressure 118/78, pulse 70, temperature 98 F (36.7 C), temperature source Oral, resp. rate 12, height 5\' 9"  (1.753 m), weight 85.7 kg  (189 lb), SpO2 96 %. General: Alert, awake, oriented x3, in no acute distress. HEENT: normocephalic, atraumatic, anicteric sclera, pink conjunctiva, pupils equal and reactive to light and accomodation, oropharynx clear, Dried blood on the back of the head  Neck: supple, no masses or lymphadenopathy, no goiter, no bruits  Heart: Regular rate and rhythm, without murmurs, rubs or gallops. Lungs: Clear to auscultation bilaterally, no wheezing, rales or rhonchi. Abdomen: Soft, nontender, nondistended, positive bowel sounds, no masses. Extremities: No clubbing, cyanosis or edema with positive pedal pulses. Neuro: Grossly intact, no focal neurological deficits, strength 5/5 upper and lower extremities bilaterally Psych: alert and oriented x 3, normal mood and affect Skin: no rashes or lesions, warm and dry   LABS on Admission:  Basic Metabolic Panel:  Recent Labs Lab 03/26/16 0847  NA 136  K 4.6  CL 101  CO2 28  GLUCOSE 135*  BUN 17  CREATININE 0.92  CALCIUM 9.1   Liver Function Tests: No results for input(s): AST, ALT, ALKPHOS, BILITOT, PROT, ALBUMIN  in the last 168 hours. No results for input(s): LIPASE, AMYLASE in the last 168 hours. No results for input(s): AMMONIA in the last 168 hours. CBC:  Recent Labs Lab 03/26/16 0847  WBC 15.0*  HGB 16.4  HCT 46.6  MCV 93.8  PLT 121*   Cardiac Enzymes: No results for input(s): CKTOTAL, CKMB, CKMBINDEX, TROPONINI in the last 168 hours. BNP: Invalid input(s): POCBNP CBG:  Recent Labs Lab 03/26/16 0853  GLUCAP 118*    Radiological Exams on Admission:  No results found.  *I have personally reviewed the images above*  EKG: Independently reviewed. EKG with normal sinus rhythm, rate 75, no acute ST-T wave changes suggestive of ischemia    Assessment/Plan Principal Problem:   Syncope possibly vasovagal versus due to medication effect from chlorpheniramine or recent URI  -  admit for observation, telemetry, obtain serial  cardiac enzymes  - Obtain 2-D echocardiogram, orthostatic vitals negative - UA with ketones, place on gentle hydration   Active Problems:   Essential hypertensionContinue -  continue metoprolol     Hyperlipidemia - Continue Lipitor     Coronary artery disease - Currently stable, EKG with no ST-T wave changes  - Continue aspirin, Plavix, statin, beta blocker    GERD - Continue PPI  DVT prophylaxis:  Lovenox   CODE STATUS:  full CODE STATUS   Consults called: none   Family Communication: Admission, patients condition and plan of care including tests being ordered have been discussed with the patient and wife  who indicates understanding and agree with the plan and Code Status  Admission status: obs   Disposition plan: Further plan will depend as patient's clinical course evolves and further radiologic and laboratory data become available. Likely home    At the time of admission, it appears that the appropriate admission status for this patient is observation . This is judged to be reasonable and necessary in order to provide the required intensity of service to ensure the patient's safety given the presenting symptoms, physical exam findings, and initial radiographic and laboratory data in the context of their chronic comorbidities.    Time Spent on Admission:   Frayda Egley M.D. [facility] 03/26/2016, 12:26 PM Pager: [phone]  If 7PM-7AM, please contact night-coverage [email or web address] Password [email or web address]

## 2016-03-26 NOTE — ED Provider Notes (Signed)
[facility]-EMERGENCY DEPT Provider Note   CSN: [id number] Arrival date & time: 03/26/16  0818     History   Chief Complaint No chief complaint on file.   HPI Kenneth Sanchez is a 65 y.o. male.  65 year old male with past medical history including CAD s/p CABG, HTN, HLD who p/w syncope and head injury. This morning, the patient walked to the bathroom and sat on the toilet. He notes that he was bearing down like he needed to have a bowel movement. He remembers standing up and flushing the toilet and the next thing he remembers is being on the ground just outside of the bathroom, found by his wife on the floor. He struck the back of his head. He denies any chest pain or shortness of breath surrounding the events. This is never happened before. Other than the pain in the back of his head he denies any other injuries. He has been sick for last 5 days with hoarseness, sore throat, mild cough, and nasal congestion. He denies any shortness of breath from the cough. No fevers. He has been eating and drinking normally.   The history is provided by the patient.    Past Medical History:  Diagnosis Date  . Abnormal stress test 2006   NSVT; had cardiac cath-12/05/04  . CAD (coronary artery disease)    CABG x4- 05/06/99; cath with PCI 12/05/2004  . Hyperlipemia   . Hypertension     Patient Active Problem List   Diagnosis Date Noted  . Syncope 03/26/2016  . Essential hypertension 11/24/2012  . Hyperlipidemia 11/24/2012  . Coronary artery disease 11/24/2012    Past Surgical History:  Procedure Laterality Date  . CORONARY ANGIOPLASTY WITH STENT PLACEMENT  12/05/2004   cypher stent 2.5x85mm to mid RCA, inflation to 14 atmospheres  . CORONARY ARTERY BYPASS GRAFT  05/06/99   x4; LIMA to distal LAD; RIMA to distal RCA, SVG to first diag branch, SVG to circ marginal branch       Home Medications    Prior to Admission medications   Medication Sig Start Date End Date Taking? Authorizing Provider    Ascorbic Acid (VITAMIN C) 1000 MG tablet Take 1,000 mg by mouth daily.   Yes Historical Provider, MD  aspirin 81 MG tablet Take 81 mg by mouth every evening.    Yes Historical Provider, MD  atorvastatin (LIPITOR) 40 MG tablet Take 40 mg by mouth at bedtime.    Yes Historical Provider, MD  clopidogrel (PLAVIX) 75 MG tablet Take 75 mg by mouth daily.   Yes Historical Provider, MD  Dextromethorphan-Guaifenesin (CORICIDIN HBP CONGESTION/COUGH PO) Take 2 tablets by mouth every 6 (six) hours as needed (cold symptoms).   Yes Historical Provider, MD  esomeprazole (NEXIUM) 40 MG capsule Take 40 mg by mouth at bedtime.   Yes Historical Provider, MD  folic acid (FOLVITE) 400 MCG tablet Take 400 mcg by mouth every evening.    Yes Historical Provider, MD  metoprolol (LOPRESSOR) 50 MG tablet TAKE 1 & 1/2 (ONE AND ONE- HALF) TABLETS BY MOUTH TWICE A DAY AS DIRECTED Patient taking differently: TAKE 1 & 1/2 (ONE AND ONE- HALF)= 75mg  TABLETS BY MOUTH TWICE A DAY AS DIRECTED 02/11/15  Yes Runell Gess, MD  Multiple Vitamin (MULTIVITAMIN) capsule Take 1 capsule by mouth daily.   Yes Historical Provider, MD  niacin (NIASPAN) 1000 MG CR tablet Take 2,000 mg by mouth at bedtime.   Yes Historical Provider, MD  NITROSTAT 0.4 MG SL tablet Take  0.4 mg by mouth every 5 (five) minutes as needed for chest pain (take up to 3 times daily).  09/13/12  Yes Historical Provider, MD  Omega-3 Fatty Acids (FISH OIL) 600 MG CAPS Take 2,400 mg by mouth daily.    Yes Historical Provider, MD  ranitidine (ZANTAC) 150 MG tablet Take 150 mg by mouth daily.   Yes Historical Provider, MD    Family History Family History  Problem Relation Age of Onset  . Heart disease Mother 86  . COPD Father     Social History Social History  Substance Use Topics  . Smoking status: Never Smoker  . Smokeless tobacco: Not on file  . Alcohol use No     Allergies   Patient has no known allergies.   Review of Systems Review of Systems 10  Systems reviewed and are negative for acute change except as noted in the HPI.   Physical Exam Updated Vital Signs BP 125/70   Pulse 75   Temp 98 F (36.7 C) (Oral)   Resp 17   Ht 5\' 9"  (1.753 m)   Wt 189 lb (85.7 kg)   SpO2 100%   BMI 27.91 kg/m   Physical Exam  Constitutional: He is oriented to person, place, and time. He appears well-developed and well-nourished. No distress.  Awake, alert  HENT:  Head: Normocephalic.  B/l cerumen impaction Large hematoma posterior scalp with overlying abrasion  Eyes: Conjunctivae and EOM are normal. Pupils are equal, round, and reactive to light.  Neck: Neck supple.  Cardiovascular: Normal rate, regular rhythm and normal heart sounds.   No murmur heard. Pulmonary/Chest: Effort normal and breath sounds normal. No respiratory distress.  Abdominal: Soft. Bowel sounds are normal. He exhibits no distension. There is no tenderness.  Musculoskeletal: He exhibits no edema or deformity.  Neurological: He is alert and oriented to person, place, and time. He has normal reflexes. No cranial nerve deficit. He exhibits normal muscle tone.  Fluent speech, normal finger-to-nose testing, negative pronator drift, no clonus 5/5 strength and normal sensation x all 4 extremities  Skin: Skin is warm and dry.  Psychiatric: He has a normal mood and affect. Judgment and thought content normal.  Nursing note and vitals reviewed.    ED Treatments / Results  Labs (all labs ordered are listed, but only abnormal results are displayed) Labs Reviewed  BASIC METABOLIC PANEL - Abnormal; Notable for the following:       Result Value   Glucose, Bld 135 (*)    All other components within normal limits  CBC - Abnormal; Notable for the following:    WBC 15.0 (*)    Platelets 121 (*)    All other components within normal limits  URINALYSIS, ROUTINE W REFLEX MICROSCOPIC - Abnormal; Notable for the following:    Ketones, ur 5 (*)    All other components within normal  limits  CBG MONITORING, ED - Abnormal; Notable for the following:    Glucose-Capillary 118 (*)    All other components within normal limits    EKG  EKG Interpretation  Date/Time:  Thursday March 26 2016 08:37:15 EST Ventricular Rate:  75 PR Interval:    QRS Duration: 108 QT Interval:  394 QTC Calculation: 441 R Axis:   69 Text Interpretation:  Sinus rhythm Probable left atrial enlargement Baseline wander in lead(s) V6 since previous tracing, rate faster Otherwise no significant change Confirmed by Delman Goshorn MD, Lester Crickenberger ([id number]) on 03/26/2016 9:04:34 AM       Radiology  Dg Chest 2 View  Result Date: 03/26/2016 CLINICAL DATA:  Patient status post fall. School laceration. Cough. EXAM: CHEST  2 VIEW COMPARISON:  Chest radiograph 10/10/2015. FINDINGS: Normal cardiac and mediastinal contours. Tortuosity of the thoracic aorta. Patient status post median sternotomy. No consolidative pulmonary opacities. No pleural effusion or pneumothorax. Mid thoracic spine degenerative changes. IMPRESSION: No active cardiopulmonary disease. Electronically Signed   By: Annia Beltrew  Davis M.D.   On: 03/26/2016 10:43   Ct Head Wo Contrast  Result Date: 03/26/2016 CLINICAL DATA:  Syncope with fall and posterior scalp laceration. Initial encounter. EXAM: CT HEAD WITHOUT CONTRAST CT CERVICAL SPINE WITHOUT CONTRAST TECHNIQUE: Multidetector CT imaging of the head and cervical spine was performed following the standard protocol without intravenous contrast. Multiplanar CT image reconstructions of the cervical spine were also generated. COMPARISON:  None. FINDINGS: CT HEAD FINDINGS Brain: No evidence of acute infarction, hemorrhage, hydrocephalus, extra-axial collection or mass lesion/mass effect. Vascular: Calcification noted along the pericallosal arteries. Carotid siphon atherosclerotic calcification. Skull: Large left parietal scalp hematoma with gas from laceration. No underlying calvarial fracture. Sinuses/Orbits: Negative CT  CERVICAL SPINE FINDINGS Alignment: C3-4 anterolisthesis is considered facet mediated. No suspected traumatic malalignment. Skull base and vertebrae: Negative for acute fracture. Soft tissues and spinal canal: No prevertebral fluid or swelling. No visible canal hematoma. Carotid atherosclerotic calcification. Disc levels: Advanced upper cervical facet arthropathy with C3-4 anterolisthesis. Degenerative disc narrowing and ridging from C3-4 to C6-7. Foraminal narrowing most notable bilaterally at C3-4. Upper chest: No acute finding IMPRESSION: 1. No evidence of acute intracranial or cervical spine injury. 2. Large left parietal scalp hematoma with laceration. No underlying fracture. Electronically Signed   By: Marnee SpringJonathon  Watts M.D.   On: 03/26/2016 10:11   Ct Cervical Spine Wo Contrast  Result Date: 03/26/2016 CLINICAL DATA:  Syncope with fall and posterior scalp laceration. Initial encounter. EXAM: CT HEAD WITHOUT CONTRAST CT CERVICAL SPINE WITHOUT CONTRAST TECHNIQUE: Multidetector CT imaging of the head and cervical spine was performed following the standard protocol without intravenous contrast. Multiplanar CT image reconstructions of the cervical spine were also generated. COMPARISON:  None. FINDINGS: CT HEAD FINDINGS Brain: No evidence of acute infarction, hemorrhage, hydrocephalus, extra-axial collection or mass lesion/mass effect. Vascular: Calcification noted along the pericallosal arteries. Carotid siphon atherosclerotic calcification. Skull: Large left parietal scalp hematoma with gas from laceration. No underlying calvarial fracture. Sinuses/Orbits: Negative CT CERVICAL SPINE FINDINGS Alignment: C3-4 anterolisthesis is considered facet mediated. No suspected traumatic malalignment. Skull base and vertebrae: Negative for acute fracture. Soft tissues and spinal canal: No prevertebral fluid or swelling. No visible canal hematoma. Carotid atherosclerotic calcification. Disc levels: Advanced upper cervical  facet arthropathy with C3-4 anterolisthesis. Degenerative disc narrowing and ridging from C3-4 to C6-7. Foraminal narrowing most notable bilaterally at C3-4. Upper chest: No acute finding IMPRESSION: 1. No evidence of acute intracranial or cervical spine injury. 2. Large left parietal scalp hematoma with laceration. No underlying fracture. Electronically Signed   By: Marnee SpringJonathon  Watts M.D.   On: 03/26/2016 10:11    Procedures .Ear Cerumen Removal Date/Time: 03/26/2016 11:47 AM Performed by: Laurence SpatesLITTLE, Delena Casebeer MORGAN Authorized by: Laurence SpatesLITTLE, Jamillia Closson MORGAN   Consent:    Consent obtained:  Verbal   Consent given by:  Patient Procedure details:    Location:  L ear and R ear   Procedure type: curette   Post-procedure details:    Inspection:  TM intact   Hearing quality:  Improved   Patient tolerance of procedure:  Tolerated well, no immediate complications Comments:  R ear canal cleared with curette alone, L ear required both curette and irrigation with water and peroxide   (including critical care time)  Medications Ordered in ED Medications  Tdap (BOOSTRIX) injection 0.5 mL (0.5 mLs Intramuscular Given 03/26/16 1057)     Initial Impression / Assessment and Plan / ED Course  I have reviewed the triage vital signs and the nursing notes.  Pertinent labs & imaging results that were available during my care of the patient were reviewed by me and considered in my medical decision making (see chart for details).  Clinical Course    PT w/ syncopal episode after being on toilet straining. He was found on floor by wife. Neurologically intact with normal neuro exam and reassuring vital signs. EKG without concerning findings compared to previous. Updated tetanus and obtained CT of head and C-spine. Basic lab work notable only for leukocyte ptosis of WBC 15,000. Obtained chest x-ray given cold symptoms. Wound on scalp cleaned by nurse.  Chest x-ray unremarkable. CT of head and neck negative for  intracranial or C-spine injury. I'm concerned about the patient's syncopal episode without preceding symptoms in the setting of coronary artery disease at age 65. I recommended admission for syncope workup. Discussed admission with [facility] hospitalist, Dr. Isidoro Donningai, and pt admitted for further evaluation.  Final Clinical Impressions(s) / ED Diagnoses   Final diagnoses:  Syncope, unspecified syncope type  Hematoma of scalp, initial encounter  Bilateral impacted cerumen    New Prescriptions New Prescriptions   No medications on file     Laurence Spatesachel Morgan Naaman Curro, MD 03/26/16 1149

## 2016-03-26 NOTE — ED Notes (Signed)
ED Provider at bedside. EDP LITTLE PRESENT.[name]. Pt remembers going to bathroom. He remembers straining to go however did nothing. He remembers flushing toilet. That's it. Wife found on floor. He appeared confused at event. Wife states he is at baseline now.  He states he has had a cold for several days and has been taking OTC meds for symptoms. Posterior laceration. EDP EVALUATED. REQUESTING WOUND CARE

## 2016-03-26 NOTE — Progress Notes (Signed)
  Echocardiogram 2D Echocardiogram has been performed.  Nolon RodBrown, Tony 03/26/2016, 2:53 PM

## 2016-03-27 DIAGNOSIS — I10 Essential (primary) hypertension: Secondary | ICD-10-CM

## 2016-03-27 DIAGNOSIS — R55 Syncope and collapse: Secondary | ICD-10-CM | POA: Diagnosis not present

## 2016-03-27 LAB — TROPONIN I: Troponin I: <0.03 ng/mL (ref <0.03)

## 2016-03-27 LAB — GLUCOSE, CAPILLARY: GLUCOSE-CAPILLARY: 100 mg/dL — AB (ref 65–99)

## 2016-03-27 NOTE — Care Management Note (Signed)
Case Management Note  Patient Details  Name: Kenneth Sanchez MRN: [id number] Date of Birth: 05-07-1950  Subjective/Objective:65 y/o m admitted w/Syncope. From home.                    Action/Plan:d/c home.   Expected Discharge Date:   (unknown)               Expected Discharge Plan:  Home/Self Care  In-House Referral:     Discharge planning Services  CM Consult  Post Acute Care Choice:    Choice offered to:     DME Arranged:    DME Agency:     HH Arranged:    HH Agency:     Status of Service:  Completed, signed off  If discussed at [employer] Length of Stay Meetings, dates discussed:    Additional Comments:  Lanier ClamMahabir, Amaranta Mehl, RN 03/27/2016, 11:55 AM

## 2016-03-27 NOTE — Discharge Summary (Signed)
Physician Discharge Summary  Kenneth Sanchez JXB:[id number] DOB: April 28, 1950 DOA: 03/26/2016  PCP: Londell Moh, MD  Admit date: 03/26/2016 Discharge date: 03/27/2016  Admitted From: home Disposition:  Home  Recommendations for Outpatient Follow-up:  1. Follow up with PCP in 1-2 weeks   Home Health:No Equipment/Devices:None  Discharge Condition:stable CODE STATUS:full Diet recommendation: Heart Healthy   Brief/Interim Summary: 65 year old male with history of CAD status post CABG, hypertension, hyperlipidemia presented from home with a syncopal episode today. History was obtained from patient and his wife. The patient reported that he woke up around 5:45 AM this morning, went to the bathroom, was straining on his toilet for BM, subsequently flushed his toilet and was walking out of the bathroom. Next thing he remembers was being on the floor found by his wife  Discharge Diagnoses:  Principal Problem:   Syncope Active Problems:   Essential hypertension   Hyperlipidemia   Coronary artery disease  He's had no events on telemetry, cardiac markers negative 3 orthostatics were negative. KVO IV fluids 2-D echo showed no aortic stenosis. He relates he's had no further events he related he was straining significantly with his bowel movement, so this likely vasovagal deification response.  Discharge Instructions  Discharge Instructions    Diet - low sodium heart healthy    Complete by:  As directed    Increase activity slowly    Complete by:  As directed        Medication List    TAKE these medications   aspirin 81 MG tablet Take 81 mg by mouth every evening.   atorvastatin 40 MG tablet Commonly known as:  LIPITOR Take 40 mg by mouth at bedtime.   clopidogrel 75 MG tablet Commonly known as:  PLAVIX Take 75 mg by mouth daily.   CORICIDIN HBP CONGESTION/COUGH PO Take 2 tablets by mouth every 6 (six) hours as needed (cold symptoms).   esomeprazole 40 MG  capsule Commonly known as:  NEXIUM Take 40 mg by mouth at bedtime.   Fish Oil 600 MG Caps Take 2,400 mg by mouth daily.   folic acid 400 MCG tablet Commonly known as:  FOLVITE Take 400 mcg by mouth every evening.   metoprolol 50 MG tablet Commonly known as:  LOPRESSOR TAKE 1 & 1/2 (ONE AND ONE- HALF) TABLETS BY MOUTH TWICE A DAY AS DIRECTED What changed:  See the new instructions.   multivitamin capsule Take 1 capsule by mouth daily.   niacin 1000 MG CR tablet Commonly known as:  NIASPAN Take 2,000 mg by mouth at bedtime.   NITROSTAT 0.4 MG SL tablet Generic drug:  nitroGLYCERIN Take 0.4 mg by mouth every 5 (five) minutes as needed for chest pain (take up to 3 times daily).   ranitidine 150 MG tablet Commonly known as:  ZANTAC Take 150 mg by mouth daily.   vitamin C 1000 MG tablet Take 1,000 mg by mouth daily.       No Known Allergies  Consultations:  None   Procedures/Studies: Dg Chest 2 View  Result Date: 03/26/2016 CLINICAL DATA:  Patient status post fall. School laceration. Cough. EXAM: CHEST  2 VIEW COMPARISON:  Chest radiograph 10/10/2015. FINDINGS: Normal cardiac and mediastinal contours. Tortuosity of the thoracic aorta. Patient status post median sternotomy. No consolidative pulmonary opacities. No pleural effusion or pneumothorax. Mid thoracic spine degenerative changes. IMPRESSION: No active cardiopulmonary disease. Electronically Signed   By: Annia Belt M.D.   On: 03/26/2016 10:43   Ct Head Wo Contrast  Result Date: 03/26/2016 CLINICAL DATA:  Syncope with fall and posterior scalp laceration. Initial encounter. EXAM: CT HEAD WITHOUT CONTRAST CT CERVICAL SPINE WITHOUT CONTRAST TECHNIQUE: Multidetector CT imaging of the head and cervical spine was performed following the standard protocol without intravenous contrast. Multiplanar CT image reconstructions of the cervical spine were also generated. COMPARISON:  None. FINDINGS: CT HEAD FINDINGS Brain: No  evidence of acute infarction, hemorrhage, hydrocephalus, extra-axial collection or mass lesion/mass effect. Vascular: Calcification noted along the pericallosal arteries. Carotid siphon atherosclerotic calcification. Skull: Large left parietal scalp hematoma with gas from laceration. No underlying calvarial fracture. Sinuses/Orbits: Negative CT CERVICAL SPINE FINDINGS Alignment: C3-4 anterolisthesis is considered facet mediated. No suspected traumatic malalignment. Skull base and vertebrae: Negative for acute fracture. Soft tissues and spinal canal: No prevertebral fluid or swelling. No visible canal hematoma. Carotid atherosclerotic calcification. Disc levels: Advanced upper cervical facet arthropathy with C3-4 anterolisthesis. Degenerative disc narrowing and ridging from C3-4 to C6-7. Foraminal narrowing most notable bilaterally at C3-4. Upper chest: No acute finding IMPRESSION: 1. No evidence of acute intracranial or cervical spine injury. 2. Large left parietal scalp hematoma with laceration. No underlying fracture. Electronically Signed   By: Marnee SpringJonathon  Watts M.D.   On: 03/26/2016 10:11   Ct Cervical Spine Wo Contrast  Result Date: 03/26/2016 CLINICAL DATA:  Syncope with fall and posterior scalp laceration. Initial encounter. EXAM: CT HEAD WITHOUT CONTRAST CT CERVICAL SPINE WITHOUT CONTRAST TECHNIQUE: Multidetector CT imaging of the head and cervical spine was performed following the standard protocol without intravenous contrast. Multiplanar CT image reconstructions of the cervical spine were also generated. COMPARISON:  None. FINDINGS: CT HEAD FINDINGS Brain: No evidence of acute infarction, hemorrhage, hydrocephalus, extra-axial collection or mass lesion/mass effect. Vascular: Calcification noted along the pericallosal arteries. Carotid siphon atherosclerotic calcification. Skull: Large left parietal scalp hematoma with gas from laceration. No underlying calvarial fracture. Sinuses/Orbits: Negative CT  CERVICAL SPINE FINDINGS Alignment: C3-4 anterolisthesis is considered facet mediated. No suspected traumatic malalignment. Skull base and vertebrae: Negative for acute fracture. Soft tissues and spinal canal: No prevertebral fluid or swelling. No visible canal hematoma. Carotid atherosclerotic calcification. Disc levels: Advanced upper cervical facet arthropathy with C3-4 anterolisthesis. Degenerative disc narrowing and ridging from C3-4 to C6-7. Foraminal narrowing most notable bilaterally at C3-4. Upper chest: No acute finding IMPRESSION: 1. No evidence of acute intracranial or cervical spine injury. 2. Large left parietal scalp hematoma with laceration. No underlying fracture. Electronically Signed   By: Marnee SpringJonathon  Watts M.D.   On: 03/26/2016 10:11      Subjective: No complains  Discharge Exam: Vitals:   03/26/16 2140 03/27/16 0500  BP: 116/67 121/74  Pulse: 69 (!) 58  Resp: 16 16  Temp: 99.1 F (37.3 C) 99.2 F (37.3 C)   Vitals:   03/26/16 1252 03/26/16 1342 03/26/16 2140 03/27/16 0500  BP: 128/79  116/67 121/74  Pulse: 69 69 69 (!) 58  Resp: 19 18 16 16   Temp:  99.3 F (37.4 C) 99.1 F (37.3 C) 99.2 F (37.3 C)  TempSrc:  Oral Oral Oral  SpO2: 92% 99% 99% 98%  Weight:  84.3 kg (185 lb 14.4 oz)  84.4 kg (186 lb 1.1 oz)  Height:  5\' 9"  (1.753 m)      General: Pt is alert, awake, not in acute distress Cardiovascular: RRR, S1/S2 +, no rubs, no gallops Respiratory: CTA bilaterally, no wheezing, no rhonchi Abdominal: Soft, NT, ND, bowel sounds + Extremities: no edema, no cyanosis    The results of  significant diagnostics from this hospitalization (including imaging, microbiology, ancillary and laboratory) are listed below for reference.     Microbiology: No results found for this or any previous visit (from the past 240 hour(s)).   Labs: BNP (last 3 results) No results for input(s): BNP in the last 8760 hours. Basic Metabolic Panel:  Recent Labs Lab 03/26/16 0847   NA 136  K 4.6  CL 101  CO2 28  GLUCOSE 135*  BUN 17  CREATININE 0.92  CALCIUM 9.1   Liver Function Tests: No results for input(s): AST, ALT, ALKPHOS, BILITOT, PROT, ALBUMIN in the last 168 hours. No results for input(s): LIPASE, AMYLASE in the last 168 hours. No results for input(s): AMMONIA in the last 168 hours. CBC:  Recent Labs Lab 03/26/16 0847  WBC 15.0*  HGB 16.4  HCT 46.6  MCV 93.8  PLT 121*   Cardiac Enzymes:  Recent Labs Lab 03/26/16 1431 03/26/16 1913 03/27/16 0219  TROPONINI <0.03 <0.03 <0.03   BNP: Invalid input(s): POCBNP CBG:  Recent Labs Lab 03/26/16 0853 03/27/16 0740  GLUCAP 118* 100*   D-Dimer No results for input(s): DDIMER in the last 72 hours. Hgb A1c No results for input(s): HGBA1C in the last 72 hours. Lipid Profile No results for input(s): CHOL, HDL, LDLCALC, TRIG, CHOLHDL, LDLDIRECT in the last 72 hours. Thyroid function studies No results for input(s): TSH, T4TOTAL, T3FREE, THYROIDAB in the last 72 hours.  Invalid input(s): FREET3 Anemia work up No results for input(s): VITAMINB12, FOLATE, FERRITIN, TIBC, IRON, RETICCTPCT in the last 72 hours. Urinalysis    Component Value Date/Time   COLORURINE YELLOW 03/26/2016 0900   APPEARANCEUR CLEAR 03/26/2016 0900   LABSPEC 1.010 03/26/2016 0900   PHURINE 7.0 03/26/2016 0900   GLUCOSEU NEGATIVE 03/26/2016 0900   HGBUR NEGATIVE 03/26/2016 0900   BILIRUBINUR NEGATIVE 03/26/2016 0900   KETONESUR 5 (A) 03/26/2016 0900   PROTEINUR NEGATIVE 03/26/2016 0900   NITRITE NEGATIVE 03/26/2016 0900   LEUKOCYTESUR NEGATIVE 03/26/2016 0900   Sepsis Labs Invalid input(s): PROCALCITONIN,  WBC,  LACTICIDVEN Microbiology No results found for this or any previous visit (from the past 240 hour(s)).   Time coordinating discharge: Over 30 minutes  SIGNED:   Marinda ElkFELIZ ORTIZ, Anabella Capshaw, MD  [facility] 03/27/2016, 11:31 AM Pager   If 7PM-7AM, please contact  night-coverage [email or web address] Password [email or web address]

## 2016-03-27 NOTE — Progress Notes (Signed)
Assumed care of patient from previous RN.  Agree with previous RN's assessment of patient.   

## 2016-09-18 ENCOUNTER — Telehealth: Payer: Self-pay | Admitting: Cardiovascular Disease

## 2016-09-18 NOTE — Telephone Encounter (Signed)
Requesting surgical clearance:  1. Type of surgery: Colonoscopy  2. Surgeon: Dr. Willis ModenaWilliam Outlaw  3.Surgical Date:  10/09/16  4. Medications that need to be held: Plavix for 5 days and ASA 81 for 7 days   5. CAD: Yes  6. I will defer to:  Dr. Conception OmsBerry    Contact Information:  [facility] Phone: [phone]) [phone] Fax: [phone]) [phone]

## 2016-09-29 NOTE — Telephone Encounter (Signed)
Okay to hold antiplatelet therapy for colonoscopy. 

## 2016-10-01 NOTE — Telephone Encounter (Signed)
Clearance faxed via Epic to # provided.  

## 2016-12-09 ENCOUNTER — Encounter: Payer: Self-pay | Admitting: Cardiovascular Disease

## 2016-12-09 ENCOUNTER — Ambulatory Visit (INDEPENDENT_AMBULATORY_CARE_PROVIDER_SITE_OTHER): Payer: 59 | Admitting: Cardiovascular Disease

## 2016-12-09 VITALS — BP 140/90 | HR 70 | Ht 70.0 in | Wt 184.0 lb

## 2016-12-09 DIAGNOSIS — I2510 Atherosclerotic heart disease of native coronary artery without angina pectoris: Secondary | ICD-10-CM | POA: Diagnosis not present

## 2016-12-09 DIAGNOSIS — E785 Hyperlipidemia, unspecified: Secondary | ICD-10-CM

## 2016-12-09 DIAGNOSIS — I2583 Coronary atherosclerosis due to lipid rich plaque: Secondary | ICD-10-CM

## 2016-12-09 DIAGNOSIS — I10 Essential (primary) hypertension: Secondary | ICD-10-CM | POA: Diagnosis not present

## 2016-12-09 NOTE — Assessment & Plan Note (Signed)
History of coronary artery disease status post bypass grafting  X 24 April 1999 He had RCA stenting by Dr. Jenne Campus with a drug-eluting stent 12/05/04 (2.5 mm x 13 mm). Since that time he denied chest pain or shortness of breath. Stress Myoview performed August 2014 was nonischemic.

## 2016-12-09 NOTE — Assessment & Plan Note (Signed)
History of essential hypertension with blood pressure initially 140/90. He is on metoprolol. Continue current meds at current dosing

## 2016-12-09 NOTE — Progress Notes (Signed)
12/09/2016 [name]   16-Apr-1951  [id number]  Primary Physician Merri Brunette, MD Primary Cardiologist: Runell Gess MD Nicholes Calamity, [place]  HPI:  Kenneth Sanchez is a 66 y.o. male  mildly overweight married Caucasian male, father of 3, grandfather to 3 grandchild, who works at at [employer]. I last saw him 01/14/16. He has a history of CAD, status post coronary artery bypass grafting x4 in January 2001. He apparently had an abnormal Bruce-protocol exercise stress test with nonsustained ventricular tachycardia and was catheterized by Dr. Charolette Child revealing patent grafts with an ungrafted RCA and a high-grade RCA lesion, which was stented by Dr. Lenise Herald with a drug-eluting stent (2.5 x 13 mm) December 05, 2004. His other problems include hypertension and hyperlipidemia. He does complain of some abdominal discomfort radiating to his back but denies chest pain or shortness of breath. His most recent lab work, performed in May,wwas apparently favorable but we are in the process of obtaining those results. I obtain a Myoview stress test on him August 2014 which was normal.Since I saw him a year ago he denies chest pain or shortness of breath.   Current Meds  Medication Sig  . Ascorbic Acid (VITAMIN C) 1000 MG tablet Take 1,000 mg by mouth daily.  [name] aspirin 81 MG tablet Take 81 mg by mouth every evening.   [name] atorvastatin (LIPITOR) 40 MG tablet Take 40 mg by mouth at bedtime.   . clopidogrel (PLAVIX) 75 MG tablet Take 75 mg by mouth daily.  [name] esomeprazole (NEXIUM) 40 MG capsule Take 40 mg by mouth at bedtime.  . folic acid (FOLVITE) 400 MCG tablet Take 400 mcg by mouth every evening.   . metoprolol (LOPRESSOR) 50 MG tablet TAKE 1 & 1/2 (ONE AND ONE- HALF) TABLETS BY MOUTH TWICE A DAY AS DIRECTED (Patient taking differently: TAKE 1 & 1/2 (ONE AND ONE- HALF)= 75mg  TABLETS BY MOUTH TWICE A DAY AS DIRECTED)  . Multiple Vitamin (MULTIVITAMIN) capsule Take 1 capsule by  mouth daily.  . niacin (NIASPAN) 1000 MG CR tablet Take 2,000 mg by mouth at bedtime.  [name] NITROSTAT 0.4 MG SL tablet Take 0.4 mg by mouth every 5 (five) minutes as needed for chest pain (take up to 3 times daily).   . Omega-3 Fatty Acids (FISH OIL) 600 MG CAPS Take 2,400 mg by mouth daily.      No Known Allergies  Social History   Social History  . Marital status: Married    Spouse name: N/A  . Number of children: 3  . Years of education: N/A   Occupational History  . fork lift operator [employer]   Social History Main Topics  . Smoking status: Never Smoker  . Smokeless tobacco: Never Used  . Alcohol use No  . Drug use: No  . Sexual activity: No   Other Topics Concern  . Not on file   Social History Narrative  . No narrative on file     Review of Systems: General: negative for chills, fever, night sweats or weight changes.  Cardiovascular: negative for chest pain, dyspnea on exertion, edema, orthopnea, palpitations, paroxysmal nocturnal dyspnea or shortness of breath Dermatological: negative for rash Respiratory: negative for cough or wheezing Urologic: negative for hematuria Abdominal: negative for nausea, vomiting, diarrhea, bright red blood per rectum, melena, or hematemesis Neurologic: negative for visual changes, syncope, or dizziness All other systems reviewed and are otherwise negative except as noted above.  Blood pressure 140/90, pulse 70, height 5\' 10"  (1.778 m), weight 184 lb (83.5 kg).  General appearance: alert and no distress Neck: no adenopathy, no carotid bruit, no JVD, supple, symmetrical, trachea midline and thyroid not enlarged, symmetric, no tenderness/mass/nodules Lungs: clear to auscultation bilaterally Heart: regular rate and rhythm, S1, S2 normal, no murmur, click, rub or gallop Extremities: extremities normal, atraumatic, no cyanosis or edema  EKG sinus rhythm at 70 without ST or T-wave changes. I personally reviewed this  EKG.  ASSESSMENT AND PLAN:   Essential hypertension History of essential hypertension with blood pressure initially 140/90. He is on metoprolol. Continue current meds at current dosing  Hyperlipidemia History of hyperlipidemia on statin therapy with recent lipid profile performed 11/06/16 by his PCP related to close to 11, LDL 43 and HDL of 59.  Coronary artery disease History of coronary artery disease status post bypass grafting  X 24 April 1999 He had RCA stenting by Dr. Jenne Campus with a drug-eluting stent 12/05/04 (2.5 mm x 13 mm). Since that time he denied chest pain or shortness of breath. Stress Myoview performed August 2014 was nonischemic.      Runell Gess MD FACP,FACC,FAHA, [facility] 12/09/2016 11:55 AM

## 2016-12-09 NOTE — Assessment & Plan Note (Signed)
History of hyperlipidemia on statin therapy with recent lipid profile performed 11/06/16 by his PCP related to close to 11, LDL 43 and HDL of 59.

## 2016-12-09 NOTE — Patient Instructions (Signed)

## 2017-01-15 ENCOUNTER — Ambulatory Visit: Payer: Managed Care, Other (non HMO) | Admitting: Cardiovascular Disease

## 2017-02-22 DIAGNOSIS — Z23 Encounter for immunization: Secondary | ICD-10-CM | POA: Diagnosis not present

## 2017-04-16 DIAGNOSIS — E785 Hyperlipidemia, unspecified: Secondary | ICD-10-CM | POA: Diagnosis not present

## 2017-04-16 DIAGNOSIS — I10 Essential (primary) hypertension: Secondary | ICD-10-CM | POA: Diagnosis not present

## 2017-04-16 DIAGNOSIS — Z125 Encounter for screening for malignant neoplasm of prostate: Secondary | ICD-10-CM | POA: Diagnosis not present

## 2017-04-22 DIAGNOSIS — I10 Essential (primary) hypertension: Secondary | ICD-10-CM | POA: Diagnosis not present

## 2017-04-22 DIAGNOSIS — E785 Hyperlipidemia, unspecified: Secondary | ICD-10-CM | POA: Diagnosis not present

## 2017-04-22 DIAGNOSIS — I2510 Atherosclerotic heart disease of native coronary artery without angina pectoris: Secondary | ICD-10-CM | POA: Diagnosis not present

## 2017-04-22 DIAGNOSIS — K219 Gastro-esophageal reflux disease without esophagitis: Secondary | ICD-10-CM | POA: Diagnosis not present

## 2017-09-06 DIAGNOSIS — I10 Essential (primary) hypertension: Secondary | ICD-10-CM | POA: Diagnosis not present

## 2017-09-06 DIAGNOSIS — I2510 Atherosclerotic heart disease of native coronary artery without angina pectoris: Secondary | ICD-10-CM | POA: Diagnosis not present

## 2017-09-06 DIAGNOSIS — K219 Gastro-esophageal reflux disease without esophagitis: Secondary | ICD-10-CM | POA: Diagnosis not present

## 2017-09-06 DIAGNOSIS — E785 Hyperlipidemia, unspecified: Secondary | ICD-10-CM | POA: Diagnosis not present

## 2017-09-06 DIAGNOSIS — J302 Other seasonal allergic rhinitis: Secondary | ICD-10-CM | POA: Diagnosis not present

## 2017-11-11 DIAGNOSIS — Z125 Encounter for screening for malignant neoplasm of prostate: Secondary | ICD-10-CM | POA: Diagnosis not present

## 2017-11-11 DIAGNOSIS — I10 Essential (primary) hypertension: Secondary | ICD-10-CM | POA: Diagnosis not present

## 2017-11-11 DIAGNOSIS — K219 Gastro-esophageal reflux disease without esophagitis: Secondary | ICD-10-CM | POA: Diagnosis not present

## 2017-11-16 DIAGNOSIS — Z7982 Long term (current) use of aspirin: Secondary | ICD-10-CM | POA: Diagnosis not present

## 2017-11-16 DIAGNOSIS — H9319 Tinnitus, unspecified ear: Secondary | ICD-10-CM | POA: Diagnosis not present

## 2017-11-16 DIAGNOSIS — N529 Male erectile dysfunction, unspecified: Secondary | ICD-10-CM | POA: Diagnosis not present

## 2017-11-16 DIAGNOSIS — Z0001 Encounter for general adult medical examination with abnormal findings: Secondary | ICD-10-CM | POA: Diagnosis not present

## 2017-11-16 DIAGNOSIS — Z1212 Encounter for screening for malignant neoplasm of rectum: Secondary | ICD-10-CM | POA: Diagnosis not present

## 2017-11-16 DIAGNOSIS — Z833 Family history of diabetes mellitus: Secondary | ICD-10-CM | POA: Diagnosis not present

## 2017-11-16 DIAGNOSIS — E785 Hyperlipidemia, unspecified: Secondary | ICD-10-CM | POA: Diagnosis not present

## 2017-11-16 DIAGNOSIS — K219 Gastro-esophageal reflux disease without esophagitis: Secondary | ICD-10-CM | POA: Diagnosis not present

## 2017-11-16 DIAGNOSIS — Z86010 Personal history of colonic polyps: Secondary | ICD-10-CM | POA: Diagnosis not present

## 2017-11-16 DIAGNOSIS — Z23 Encounter for immunization: Secondary | ICD-10-CM | POA: Diagnosis not present

## 2017-11-16 DIAGNOSIS — I10 Essential (primary) hypertension: Secondary | ICD-10-CM | POA: Diagnosis not present

## 2017-11-16 DIAGNOSIS — I2510 Atherosclerotic heart disease of native coronary artery without angina pectoris: Secondary | ICD-10-CM | POA: Diagnosis not present

## 2017-11-16 DIAGNOSIS — Z6828 Body mass index (BMI) 28.0-28.9, adult: Secondary | ICD-10-CM | POA: Diagnosis not present

## 2017-12-13 DIAGNOSIS — E785 Hyperlipidemia, unspecified: Secondary | ICD-10-CM | POA: Diagnosis not present

## 2017-12-13 DIAGNOSIS — I2510 Atherosclerotic heart disease of native coronary artery without angina pectoris: Secondary | ICD-10-CM | POA: Diagnosis not present

## 2017-12-26 IMAGING — CT CT CERVICAL SPINE W/O CM
4 of 8 series · 13 of 33 positions shown, 14 images · non-contrast
Comparison: None.

CLINICAL DATA: Syncope with fall and posterior scalp laceration.
Initial encounter.

EXAM:
CT HEAD WITHOUT CONTRAST
CT CERVICAL SPINE WITHOUT CONTRAST
TECHNIQUE: Multidetector CT imaging of the head and cervical spine was
performed following the standard protocol without intravenous
contrast. Multiplanar CT image reconstructions of the cervical spine
were also generated.

[Series 5: coronal · coronal · 0.34mm/px · 2 of 66 slices shown]
[im 22/66  bone]
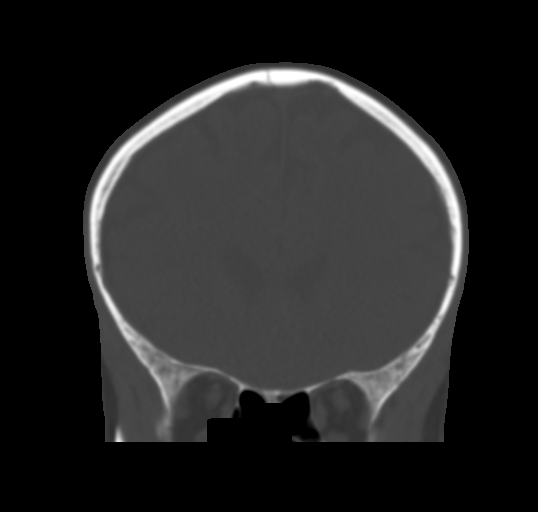
[im 44/66  bone]
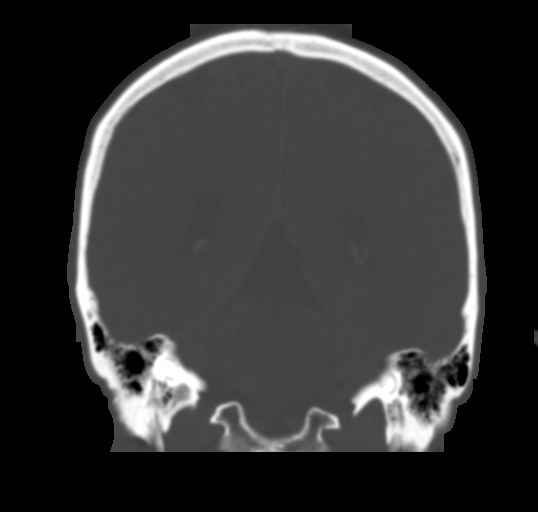

[Series 7: c-spine st · axial · 0.29mm/px · z∈[+1287,+1383]mm · 3 of 97 slices shown, 4 images]
[im 25/97  soft-tissue]
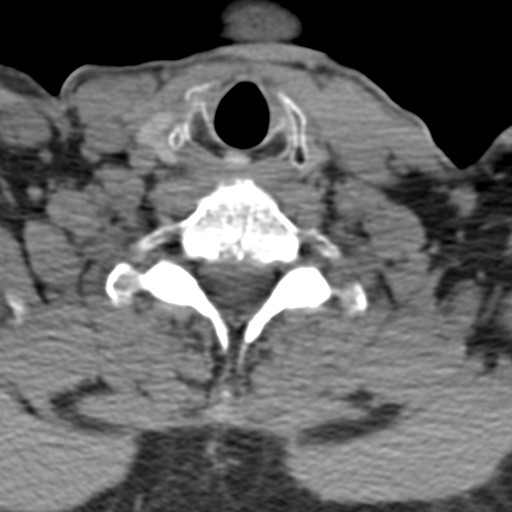
[im 25/97  bone]
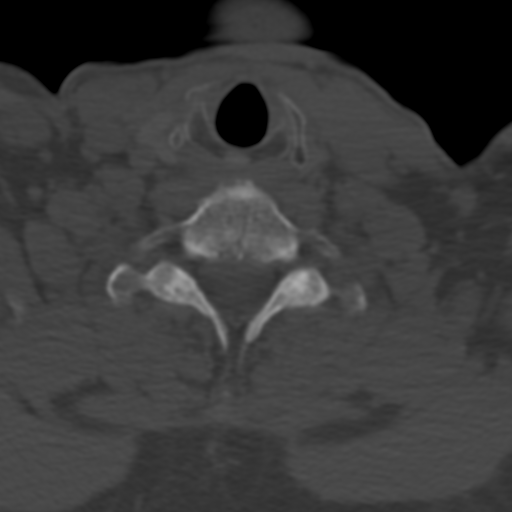
[im 49/97  bone]
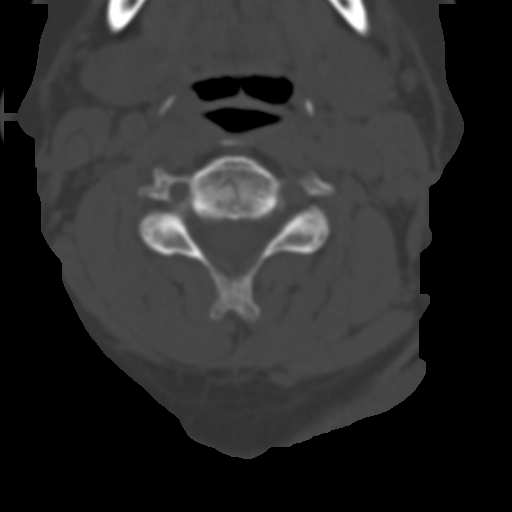
[im 73/97  bone]
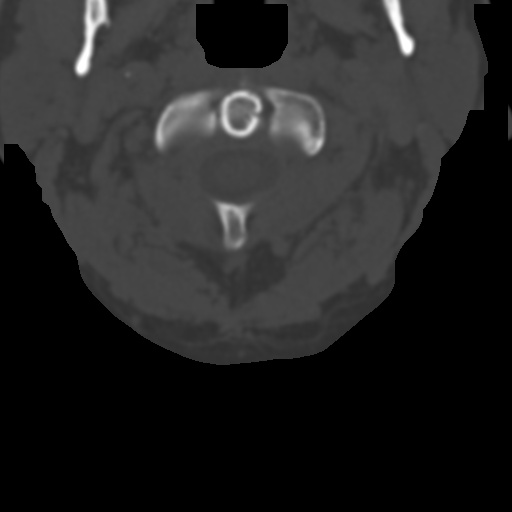

[Series 10: axial recon · axial · 0.23mm/px · z∈[+1261,+1367]mm · 3 of 111 slices shown]
[im 28/111  bone]
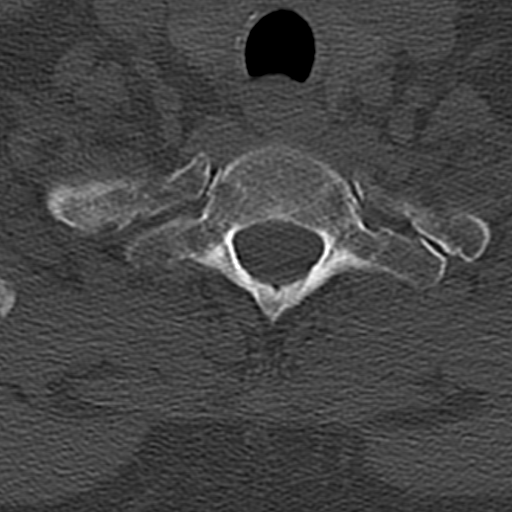
[im 56/111  bone]
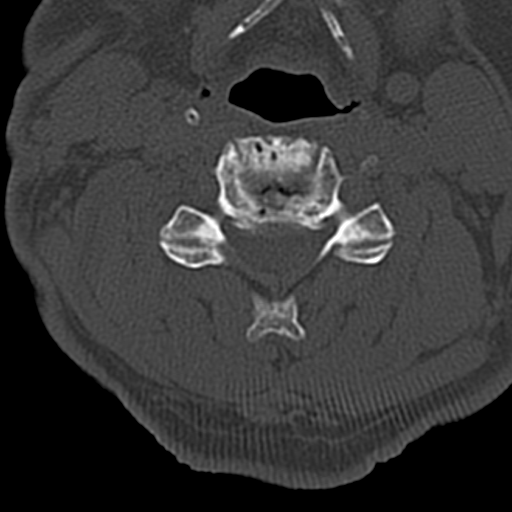
[im 83/111  bone]
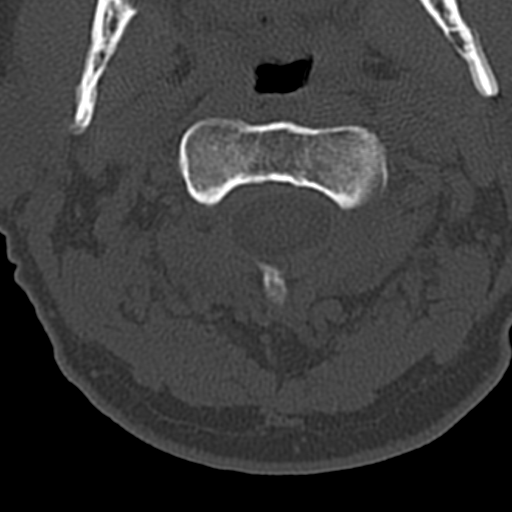

[Series 12: sagittal · sagittal · 0.40mm/px · 5 of 61 slices shown]
[im 11/61  bone]
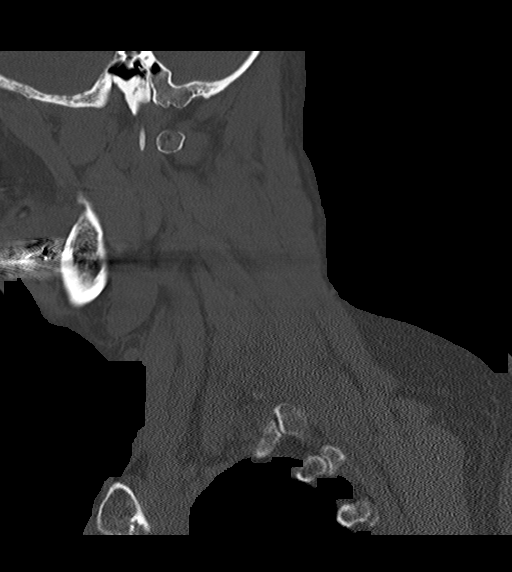
[im 21/61  bone]
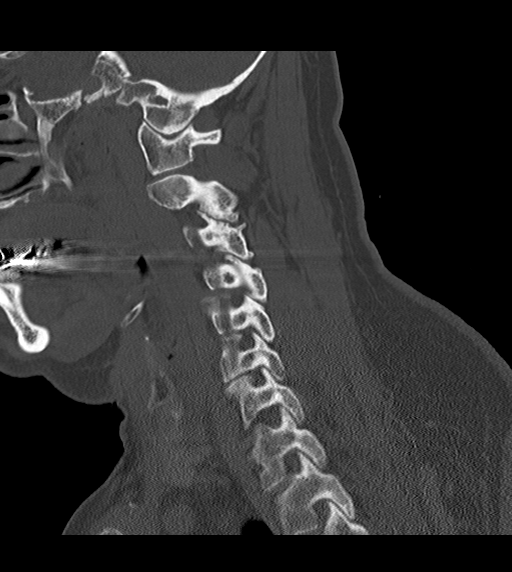
[im 31/61  bone]
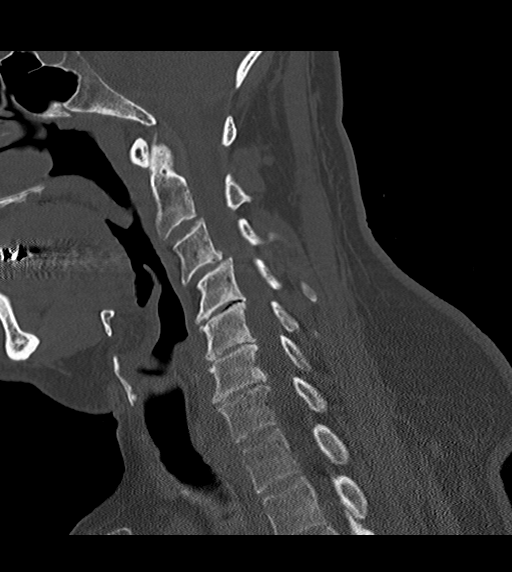
[im 41/61  bone]
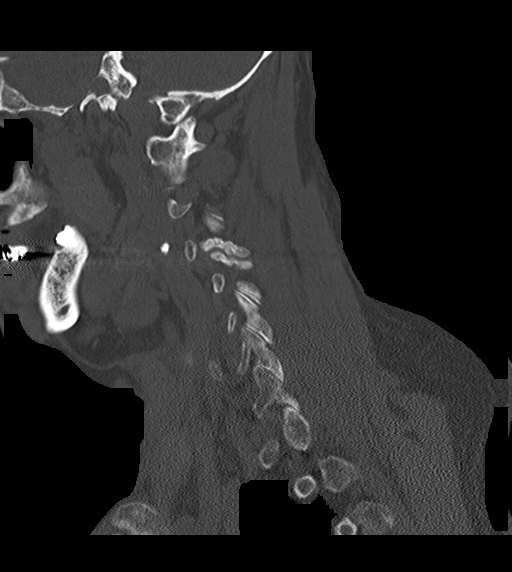
[im 51/61  bone]
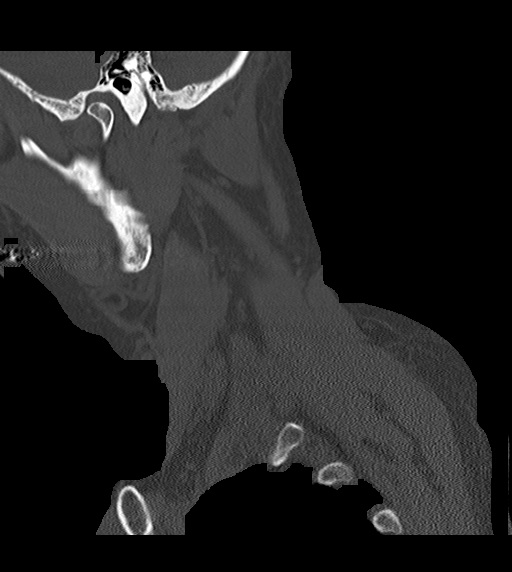

[13 of 33 positions shown; findings below may reference images not displayed]

FINDINGS: CT HEAD FINDINGS

Brain: No evidence of acute infarction, hemorrhage, hydrocephalus,
extra-axial collection or mass lesion/mass effect.

Vascular: Calcification noted along the pericallosal arteries.
Carotid siphon atherosclerotic calcification.

Skull: Large left parietal scalp hematoma with gas from laceration.
No underlying calvarial fracture.

Sinuses/Orbits: Negative

CT CERVICAL SPINE FINDINGS

Alignment: C3-4 anterolisthesis is considered facet mediated. No
suspected traumatic malalignment.

Skull base and vertebrae: Negative for acute fracture.

Soft tissues and spinal canal: No prevertebral fluid or swelling. No
visible canal hematoma. Carotid atherosclerotic calcification.

Disc levels: Advanced upper cervical facet arthropathy with C3-4
anterolisthesis. Degenerative disc narrowing and ridging from C3-4
to C6-7. Foraminal narrowing most notable bilaterally at C3-4.

Upper chest: No acute finding
IMPRESSION: 1. No evidence of acute intracranial or cervical spine injury.
2. Large left parietal scalp hematoma with laceration. No underlying
fracture.

## 2017-12-26 IMAGING — CR DG CHEST 2V
2 series · 2 of 2 positions shown · non-contrast
Comparison: Chest radiograph 10/10/2015.

CLINICAL DATA: Patient status post fall. School laceration. Cough.

EXAM:
CHEST  2 VIEW

[w chest pa]
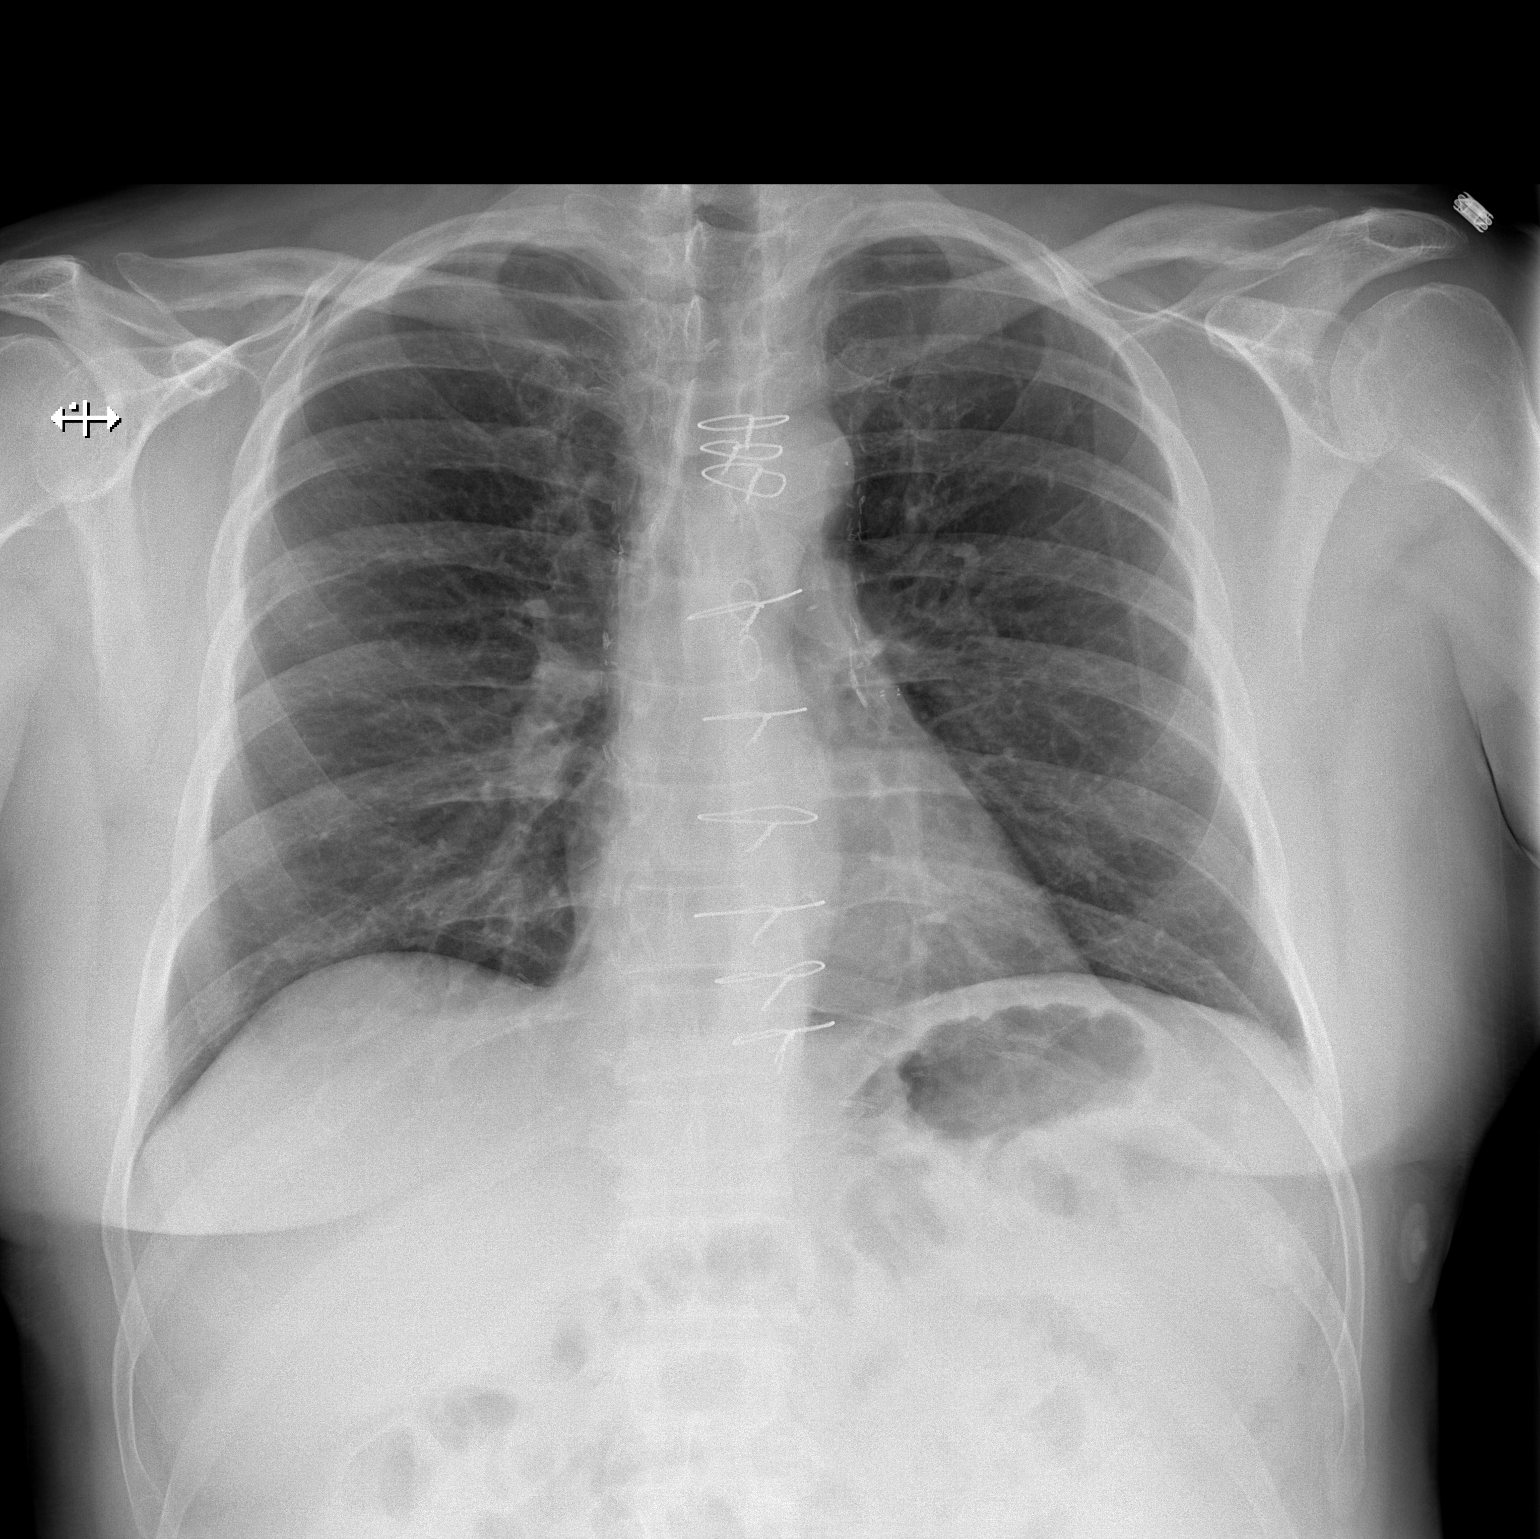

[w chest lat]
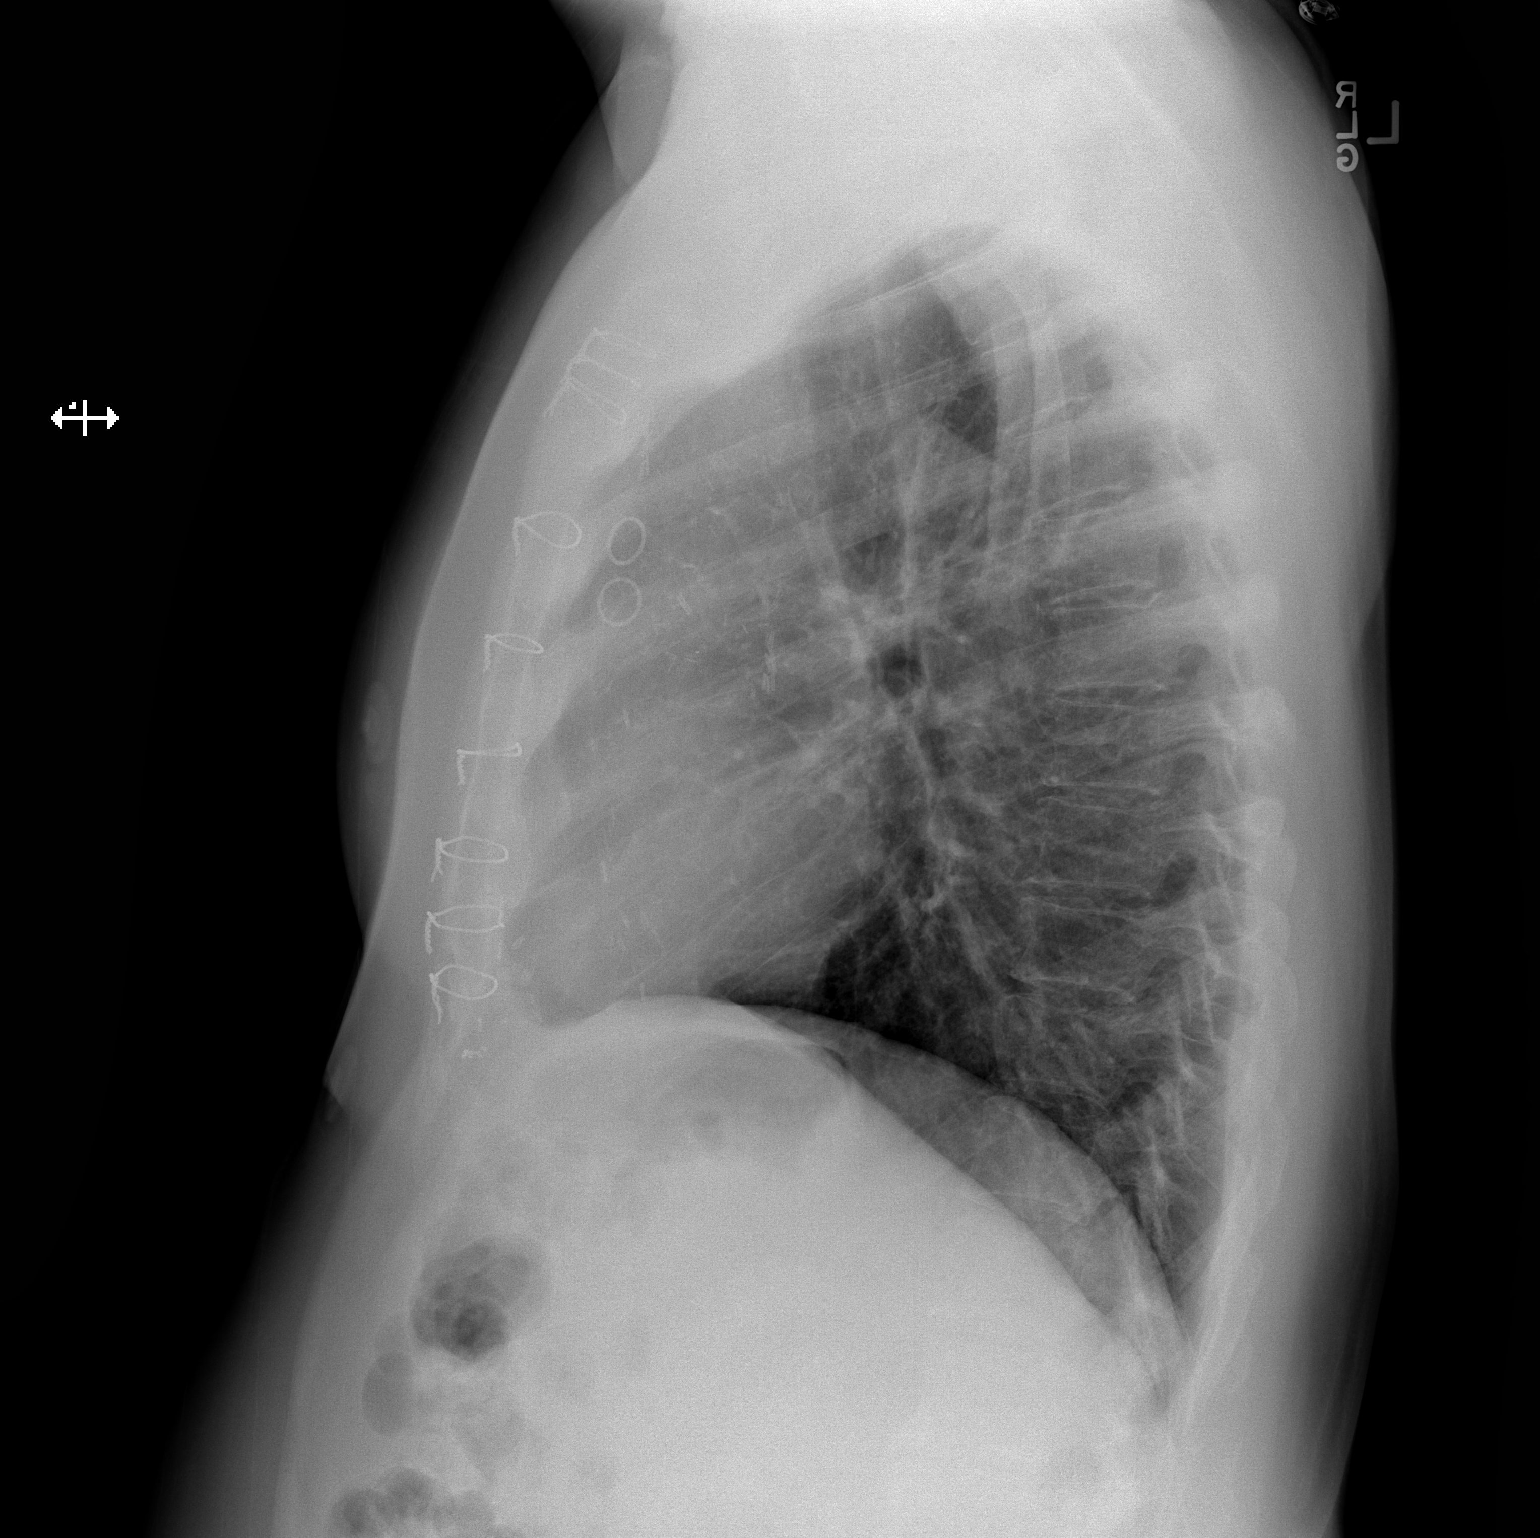

[2 of 2 positions shown; findings below may reference images not displayed]

FINDINGS: Normal cardiac and mediastinal contours. Tortuosity of the thoracic
aorta. Patient status post median sternotomy. No consolidative
pulmonary opacities. No pleural effusion or pneumothorax. Mid
thoracic spine degenerative changes.
IMPRESSION: No active cardiopulmonary disease.

## 2018-02-11 ENCOUNTER — Ambulatory Visit (INDEPENDENT_AMBULATORY_CARE_PROVIDER_SITE_OTHER): Payer: Medicare Other | Admitting: Cardiovascular Disease

## 2018-02-11 ENCOUNTER — Encounter: Payer: Self-pay | Admitting: Cardiovascular Disease

## 2018-02-11 VITALS — BP 146/90 | HR 68 | Ht 69.0 in | Wt 190.0 lb

## 2018-02-11 DIAGNOSIS — I2510 Atherosclerotic heart disease of native coronary artery without angina pectoris: Secondary | ICD-10-CM | POA: Diagnosis not present

## 2018-02-11 DIAGNOSIS — I2583 Coronary atherosclerosis due to lipid rich plaque: Secondary | ICD-10-CM | POA: Diagnosis not present

## 2018-02-11 DIAGNOSIS — I10 Essential (primary) hypertension: Secondary | ICD-10-CM

## 2018-02-11 DIAGNOSIS — E7800 Pure hypercholesterolemia, unspecified: Secondary | ICD-10-CM

## 2018-02-11 NOTE — Progress Notes (Signed)
02/11/2018 Kenneth Sanchez   September 28, 1950  [id number]  Primary Physician Merri Brunette, MD Primary Cardiologist: Runell Gess MD Nicholes Calamity, [place]  HPI:  Kenneth Sanchez is a 67 y.o.   mildly overweight married Caucasian male, father of 3, grandfather to 3 grandchild, who works at at [employer]. I last saw him 12/09/2016. He has a history of CAD, status post coronary artery bypass grafting x4 in January 2001. He apparently had an abnormal Bruce-protocol exercise stress test with nonsustained ventricular tachycardia and was catheterized by Dr. Charolette Child revealing patent grafts with an ungrafted RCA and a high-grade RCA lesion, which was stented by Dr. Lenise Herald with a drug-eluting stent (2.5 x 13 mm) December 05, 2004. His other problems include hypertension and hyperlipidemia. He does complain of some abdominal discomfort radiating to his back but denies chest pain or shortness of breath. His most recent lab work, performed in May,wwas apparently favorable but we are in the process of obtaining those results. I obtain a Myoview stress test on him August 2014 which was normal  Since I saw him a year ago he is remained stable.  He denies chest pain or shortness of breath.  Dr. Norma Fredrickson follows his lipid profile performed 11/11/2017 revealing total cholesterol 110, LDL 46 and HDL of 44.   Current Meds  Medication Sig  . Ascorbic Acid (VITAMIN C) 1000 MG tablet Take 1,000 mg by mouth daily.  [name] aspirin 81 MG tablet Take 81 mg by mouth every evening.   [name] atorvastatin (LIPITOR) 40 MG tablet Take 40 mg by mouth at bedtime.   . folic acid (FOLVITE) 400 MCG tablet Take 400 mcg by mouth every evening.   . metoprolol (LOPRESSOR) 50 MG tablet TAKE 1 & 1/2 (ONE AND ONE- HALF) TABLETS BY MOUTH TWICE A DAY AS DIRECTED (Patient taking differently: TAKE 1 & 1/2 (ONE AND ONE- HALF)= 75mg  TABLETS BY MOUTH TWICE A DAY AS DIRECTED)  . Multiple Vitamin (MULTIVITAMIN) capsule Take 1 capsule by  mouth daily.  [name] NITROSTAT 0.4 MG SL tablet Take 0.4 mg by mouth every 5 (five) minutes as needed for chest pain (take up to 3 times daily).   . Omega-3 Fatty Acids (FISH OIL) 600 MG CAPS Take 2,400 mg by mouth daily.   . ranitidine (ZANTAC) 150 MG capsule Take 150 mg by mouth daily.  . [DISCONTINUED] clopidogrel (PLAVIX) 75 MG tablet Take 75 mg by mouth daily.  . [DISCONTINUED] esomeprazole (NEXIUM) 40 MG capsule Take 40 mg by mouth at bedtime.  . [DISCONTINUED] niacin (NIASPAN) 1000 MG CR tablet Take 2,000 mg by mouth at bedtime.     No Known Allergies  Social History   Socioeconomic History  . Marital status: Married    Spouse name: Not on file  . Number of children: 3  . Years of education: Not on file  . Highest education level: Not on file  Occupational History  . Occupation: Production designer, theatre/television/film    Employer: [employer], [employer]  Social Needs  . Financial resource strain: Not on file  . Food insecurity:    Worry: Not on file    Inability: Not on file  . Transportation needs:    Medical: Not on file    Non-medical: Not on file  Tobacco Use  . Smoking status: Never Smoker  . Smokeless tobacco: Never Used  Substance and Sexual Activity  . Alcohol use: No  . Drug use: No  . Sexual activity: Never  Lifestyle  . Physical activity:    Days per week: Not on file    Minutes per session: Not on file  . Stress: Not on file  Relationships  . Social connections:    Talks on phone: Not on file    Gets together: Not on file    Attends religious service: Not on file    Active member of club or organization: Not on file    Attends meetings of clubs or organizations: Not on file    Relationship status: Not on file  . Intimate partner violence:    Fear of current or ex partner: Not on file    Emotionally abused: Not on file    Physically abused: Not on file    Forced sexual activity: Not on file  Other Topics Concern  . Not on file  Social History Narrative  . Not on file      Review of Systems: General: negative for chills, fever, night sweats or weight changes.  Cardiovascular: negative for chest pain, dyspnea on exertion, edema, orthopnea, palpitations, paroxysmal nocturnal dyspnea or shortness of breath Dermatological: negative for rash Respiratory: negative for cough or wheezing Urologic: negative for hematuria Abdominal: negative for nausea, vomiting, diarrhea, bright red blood per rectum, melena, or hematemesis Neurologic: negative for visual changes, syncope, or dizziness All other systems reviewed and are otherwise negative except as noted above.    Blood pressure (!) 146/90, pulse 68, height 5\' 9"  (1.753 m), weight 190 lb (86.2 kg).  General appearance: alert and no distress Neck: no adenopathy, no carotid bruit, no JVD, supple, symmetrical, trachea midline and thyroid not enlarged, symmetric, no tenderness/mass/nodules Lungs: clear to auscultation bilaterally Heart: regular rate and rhythm, S1, S2 normal, no murmur, click, rub or gallop Extremities: extremities normal, atraumatic, no cyanosis or edema Pulses: 2+ and symmetric Skin: Skin color, texture, turgor normal. No rashes or lesions Neurologic: Alert and oriented X 3, normal strength and tone. Normal symmetric reflexes. Normal coordination and gait  EKG sinus rhythm at 68 with nonspecific ST and T wave changes.  Personally reviewed this EKG.  ASSESSMENT AND PLAN:   Essential hypertension History of essential hypertension her blood pressure measured today at 140/94.  He is on metoprolol.  I reviewed his blood pressure log at home which revealed blood pressure within the normal range.  Hyperlipidemia History of hyperlipidemia on statin therapy with recent lipid profile performed by his PCP 11/11/2017 revealing total cholesterol 110, LDL 46 and HDL of 54.  Coronary artery disease History of CAD status post coronary artery bypass grafting X 24 April 1999 cath performed by Dr. Jenne Campus  12/05/2004 notable for stenting of high-grade RCA lesion with a drug-eluting stent (2.5 mm x 13 mm).  His last Myoview performed August 2014 was nonischemic.  He denies chest pain or shortness of breath.      Runell Gess MD FACP,FACC,FAHA, [facility] 02/11/2018 9:21 AM

## 2018-02-11 NOTE — Assessment & Plan Note (Signed)
History of essential hypertension her blood pressure measured today at 140/94.  He is on metoprolol.  I reviewed his blood pressure log at home which revealed blood pressure within the normal range.

## 2018-02-11 NOTE — Assessment & Plan Note (Signed)
History of hyperlipidemia on statin therapy with recent lipid profile performed by his PCP 11/11/2017 revealing total cholesterol 110, LDL 46 and HDL of 54.

## 2018-02-11 NOTE — Patient Instructions (Signed)
Medication Instructions:  STOP PLAVIX If you need a refill on your cardiac medications before your next appointment, please call your pharmacy.   Lab work: If you have labs (blood work) drawn today and your tests are completely normal, you will receive your results only by: [name] MyChart Message (if you have MyChart) OR . A paper copy in the mail If you have any lab test that is abnormal or we need to change your treatment, we will call you to review the results.  Follow-Up: At [facility], you and your health needs are our priority.  As part of our continuing mission to provide you with exceptional heart care, we have created designated Provider Care Teams.  These Care Teams include your primary Cardiologist (physician) and Advanced Practice Providers (APPs -  Physician Assistants and Nurse Practitioners) who all work together to provide you with the care you need, when you need it. You will need a follow up appointment in 12 months.  Please call our office 2 months in advance to schedule this appointment.  You may see Nanetta Batty MD or one of the following Advanced Practice Providers on your designated Care Team:   Corine Shelter, PA-C Judy Pimple, [place] . Marjie Skiff, PA-C

## 2018-02-11 NOTE — Assessment & Plan Note (Signed)
History of CAD status post coronary artery bypass grafting X 24 April 1999 cath performed by Dr. Jenne Campus 12/05/2004 notable for stenting of high-grade RCA lesion with a drug-eluting stent (2.5 mm x 13 mm).  His last Myoview performed August 2014 was nonischemic.  He denies chest pain or shortness of breath.

## 2018-03-23 DIAGNOSIS — Z23 Encounter for immunization: Secondary | ICD-10-CM | POA: Diagnosis not present

## 2018-05-04 DIAGNOSIS — H6120 Impacted cerumen, unspecified ear: Secondary | ICD-10-CM | POA: Diagnosis not present

## 2018-05-04 DIAGNOSIS — I10 Essential (primary) hypertension: Secondary | ICD-10-CM | POA: Diagnosis not present

## 2018-05-18 DIAGNOSIS — J302 Other seasonal allergic rhinitis: Secondary | ICD-10-CM | POA: Diagnosis not present

## 2018-05-18 DIAGNOSIS — I10 Essential (primary) hypertension: Secondary | ICD-10-CM | POA: Diagnosis not present

## 2018-07-26 ENCOUNTER — Telehealth: Payer: Self-pay | Admitting: Cardiovascular Disease

## 2018-07-26 NOTE — Telephone Encounter (Signed)
Follow Up:; ° ° °Returning your call. °

## 2018-07-26 NOTE — Telephone Encounter (Signed)
New Message         Added a patient to Dr. Allyson Sabal schedule @ 3:00pm on Wednesday  phone call only. Duffy Rhody Pony)

## 2018-07-26 NOTE — Telephone Encounter (Signed)
FYI. Thanks.

## 2018-07-26 NOTE — Telephone Encounter (Signed)
Left message for patient to call.  He needs to be pre-registered for his appt tomorrow.

## 2018-07-27 ENCOUNTER — Telehealth (INDEPENDENT_AMBULATORY_CARE_PROVIDER_SITE_OTHER): Payer: PPO | Admitting: Cardiovascular Disease

## 2018-07-27 ENCOUNTER — Telehealth: Payer: Self-pay

## 2018-07-27 DIAGNOSIS — E782 Mixed hyperlipidemia: Secondary | ICD-10-CM | POA: Diagnosis not present

## 2018-07-27 DIAGNOSIS — I2510 Atherosclerotic heart disease of native coronary artery without angina pectoris: Secondary | ICD-10-CM

## 2018-07-27 DIAGNOSIS — I2583 Coronary atherosclerosis due to lipid rich plaque: Secondary | ICD-10-CM | POA: Diagnosis not present

## 2018-07-27 DIAGNOSIS — I10 Essential (primary) hypertension: Secondary | ICD-10-CM

## 2018-07-27 NOTE — Progress Notes (Signed)
Virtual Visit via Video Note   This visit type was conducted due to national recommendations for restrictions regarding the COVID-19 Pandemic (e.g. social distancing) in an effort to limit this patient's exposure and mitigate transmission in our community.  Due to his co-morbid illnesses, this patient is at least at moderate risk for complications without adequate follow up.  This format is felt to be most appropriate for this patient at this time.  All issues noted in this document were discussed and addressed.  A limited physical exam was performed with this format.  Please refer to the patient's chart for his consent to telehealth for [facility].   Evaluation Performed:  Follow-up visit  Date:  07/27/2018   ID:  Kenneth IngStanley J Sanchez, DOB [birth year], MRN [id number]  Patient Location: Home  Provider Location: Home  PCP:  Merri BrunettePharr, Walter, MD  Cardiologist: Dr. Nanetta BattyJonathan Auri Sanchez Electrophysiologist:  None   Chief Complaint: Atypical chest pain  History of Present Illness:    Kenneth Sanchez is a 68 y.o. male who presents via audio/video conferencing for a telehealth visit today.    Kenneth BridgemanStanley J Touhey is a 67y.o.  mildly overweight married Caucasian male, father of 3, grandfather to 3 grandchild, who works at at [employer]. I last saw him 02/11/2018. He has a history of CAD, status post coronary artery bypass grafting x4 in January 2001. He apparently had an abnormal Bruce-protocol exercise stress test with nonsustained ventricular tachycardia and was catheterized by Dr. Charolette ChildJohn Tysinger revealing patent grafts with an ungrafted RCA and a high-grade RCA lesion, which was stented by Dr. Lenise Heraldobert McQueen with a drug-eluting stent (2.5 x 13 mm) December 05, 2004. His other problems include hypertension and hyperlipidemia. He does complain of some abdominal discomfort radiating to his back but denies chest pain or shortness of breath. His most recent lab work, performed in May,wwas apparently favorable  but we are in the process of obtaining those results. I obtain a Myoview stress test on him August 2014 which was normal    Dr. Renne CriglerPharr follows his lipid profile performed 11/11/2017 revealing total cholesterol 110, LDL 46 and HDL of 44.  Since I saw him back 6 months ago he is remained stable.  He does get some atypical musculoskeletal pain that is fairly localized and reproducible with pressing on certain spots on his right and left chest.  It does not sound anginal.  He works at [employer] 3 days/week and is abiding by sheltering in place and social distancing.  The patient does not have symptoms concerning for COVID-19 infection (fever, chills, cough, or new shortness of breath).    Past Medical History:  Diagnosis Date  . Abnormal stress test 2006   NSVT; had cardiac cath-12/05/04  . CAD (coronary artery disease)    CABG x4- 05/06/99; cath with PCI 12/05/2004  . Hyperlipemia   . Hypertension    Past Surgical History:  Procedure Laterality Date  . CORONARY ANGIOPLASTY WITH STENT PLACEMENT  12/05/2004   cypher stent 2.5x213mm to mid RCA, inflation to 14 atmospheres  . CORONARY ARTERY BYPASS GRAFT  05/06/99   x4; LIMA to distal LAD; RIMA to distal RCA, SVG to first diag branch, SVG to circ marginal branch     Current Meds  Medication Sig  . Ascorbic Acid (VITAMIN C) 1000 MG tablet Take 1,000 mg by mouth daily.  [name]. aspirin 81 MG tablet Take 81 mg by mouth every evening.   [name]. atorvastatin (LIPITOR) 40 MG tablet Take 40  mg by mouth at bedtime.   . folic acid (FOLVITE) 400 MCG tablet Take 400 mcg by mouth every evening.   . hydrochlorothiazide (HYDRODIURIL) 12.5 MG tablet Take 12.5 mg by mouth daily.  . metoprolol (LOPRESSOR) 50 MG tablet TAKE 1 & 1/2 (ONE AND ONE- HALF) TABLETS BY MOUTH TWICE A DAY AS DIRECTED (Patient taking differently: TAKE 1 & 1/2 (ONE AND ONE- HALF)= 75mg  TABLETS BY MOUTH TWICE A DAY AS DIRECTED)  . Multiple Vitamin (MULTIVITAMIN) capsule Take 1 capsule by mouth  daily.  [name] NITROSTAT 0.4 MG SL tablet Take 0.4 mg by mouth every 5 (five) minutes as needed for chest pain (take up to 3 times daily).   . Omega-3 Fatty Acids (FISH OIL) 600 MG CAPS Take 2,400 mg by mouth daily.   . ranitidine (ZANTAC) 150 MG capsule Take 150 mg by mouth daily.     Allergies:   Patient has no known allergies.   Social History   Tobacco Use  . Smoking status: Never Smoker  . Smokeless tobacco: Never Used  Substance Use Topics  . Alcohol use: No  . Drug use: No     Family Hx: The patient's family history includes COPD in his father; Heart disease (age of onset: 46) in his mother.  ROS:   Please see the history of present illness.     All other systems reviewed and are negative.   Prior CV studies:   The following studies were reviewed today:  None  Labs/Other Tests and Data Reviewed:    EKG:  No ECG reviewed.  Recent Labs: No results found for requested labs within last 8760 hours.   Recent Lipid Panel No results found for: CHOL, TRIG, HDL, CHOLHDL, LDLCALC, LDLDIRECT  Wt Readings from Last 3 Encounters:  07/27/18 187 lb (84.8 kg)  02/11/18 190 lb (86.2 kg)  12/09/16 184 lb (83.5 kg)     Objective:    Vital Signs:  BP 137/83   Pulse 62   Ht 5\' 10"  (1.778 m)   Wt 187 lb (84.8 kg)   BMI 26.83 kg/m    A physical exam was not performed since this was a telemedicine WebEx visit.  ASSESSMENT & PLAN:    1. Coronary artery disease- history of CAD status post CABG times 24 April 1999.  Cardiac catheterization performed by Dr. Jenne Campus 12/05/2004 was notable for high-grade RCA lesion.  The vessel was not bypassed.  He underwent drug-eluting stenting with a 2.5 mm x 13 mm long stent.  He had a Myoview performed August 2014 which is nonischemic.  He does get atypical chest pain which sounds musculoskeletal. 2. Hyperlipidemia- on statin therapy with lipid profile performed 11/11/2017 revealing total cholesterol 110, LDL 46 and HDL of 54. 3. Essential  hypertension- history of essential hypertension with blood pressure measured today by the patient at home of 139/82 with a pulse of 60.  He is on metoprolol and hydrochlorothiazide which was recently begun by his PCP.  COVID-19 Education: The signs and symptoms of COVID-19 were discussed with the patient and how to seek care for testing (follow up with PCP or arrange E-visit).  The importance of social distancing was discussed today.  Time:   Today, I have spent 20 minutes with the patient with telehealth technology discussing the above problems.     Medication Adjustments/Labs and Tests Ordered: Current medicines are reviewed at length with the patient today.  Concerns regarding medicines are outlined above.  Tests Ordered: No orders of the  defined types were placed in this encounter.  Medication Changes: No orders of the defined types were placed in this encounter.   Disposition:  Follow up in 6 month(s)  Signed, Nanetta Batty, MD  07/27/2018 3:11 PM    [facility]

## 2018-07-27 NOTE — Telephone Encounter (Signed)
Patient and/or DPR-approved person aware of AVS instructions and verbalized understanding. 

## 2018-07-27 NOTE — Patient Instructions (Signed)
Medication Instructions:  Your physician recommends that you continue on your current medications as directed. Please refer to the Current Medication list given to you today.  If you need a refill on your cardiac medications before your next appointment, please call your pharmacy.   Lab work: NONE If you have labs (blood work) drawn today and your tests are completely normal, you will receive your results only by: . MyChart Message (if you have MyChart) OR . A paper copy in the mail If you have any lab test that is abnormal or we need to change your treatment, we will call you to review the results.  Testing/Procedures: NONE  Follow-Up: At [facility], you and your health needs are our priority.  As part of our continuing mission to provide you with exceptional heart care, we have created designated Provider Care Teams.  These Care Teams include your primary Cardiologist (physician) and Advanced Practice Providers (APPs -  Physician Assistants and Nurse Practitioners) who all work together to provide you with the care you need, when you need it. . You will need a follow up appointment in 6 months with an APP and in 12 months with Dr. Berry.  Please call our office 2 months in advance to schedule each appointment.  You may see one of the following Advanced Practice Providers on your designated Care Team:   . Luke Kilroy, PA-C . Hao Meng, PA-C . Angela Duke, PA-C . Kathryn Lawrence, DNP . Rhonda Barrett, PA-C . Krista Kroeger, PA-C . Callie Goodrich, PA-C    

## 2018-07-28 NOTE — Telephone Encounter (Signed)
Follow Up:   Pt had an appt with Dr Allyson Sabal yesterday. He needs to give you his new insurance information.

## 2018-08-16 ENCOUNTER — Telehealth: Payer: Self-pay | Admitting: Cardiovascular Disease

## 2018-08-16 DIAGNOSIS — R079 Chest pain, unspecified: Secondary | ICD-10-CM

## 2018-08-16 NOTE — Telephone Encounter (Signed)
New Message   Pt c/o of Chest Pain: STAT if CP now or developed within 24 hours  1. Are you having CP right now? no  2. Are you experiencing any other symptoms (ex. SOB, nausea, vomiting, sweating)? No symptoms  3. How long have you been experiencing CP? For about a week   4. Is your CP continuous or coming and going? Coming and going   5. Have you taken Nitroglycerin? No  ?

## 2018-08-16 NOTE — Telephone Encounter (Signed)
Pt called to report that the chest pain he spoke with Dr. Allyson Sabal about at his televisit 07/27/18 has changed and is now in the center of his chest and is dull in nature... he says it has been on and off since Saturday 08/13/18 and is not related to rest or exertion.[name] it last a few minutes and not long enough to take a nitrostat.   He denies SOB, Dizziness, no nausea... no cough.   He says he would just like Dr. Allyson Sabal to know since they talked about it previously and the location has him nervous about the change.. last time he had pain was at work this morning but it got better after 1-2 minutes and he has felt fine since then.   Advised him I will forward to Dr. Allyson Sabal and his nurse for review and recommendation.. pt to call EMS if his symptoms worsen or change prior to hearing back from [place]. He is unaware of his BP, HR.

## 2018-08-17 NOTE — Telephone Encounter (Signed)
Start Imdur 15 mg a day and obtain a Lexiscan Myoview in the next several days to evaluate his chest pain.

## 2018-08-19 MED ORDER — ISOSORBIDE MONONITRATE ER 30 MG PO TB24: 15.0000 mg | ORAL_TABLET | Freq: Every day | ORAL | 3 refills | Status: DC

## 2018-08-19 NOTE — Telephone Encounter (Signed)
LMTCB 5/1

## 2018-08-19 NOTE — Telephone Encounter (Signed)
Order for Rx Imdur 15mg  a day and lexisican in epic

## 2018-08-19 NOTE — Addendum Note (Signed)
Addended by: Harlow Asa on: 08/19/2018 06:04 PM   Modules accepted: Orders

## 2018-08-22 ENCOUNTER — Telehealth: Payer: Self-pay | Admitting: Cardiovascular Disease

## 2018-08-22 NOTE — Telephone Encounter (Signed)
Spoke with pt who is aware of recommendations to start Imdur 15mg  daily and states he has picked up med and started taking today. Pt also aware of recommendation for lexiscan and that he will be contacted by scheduler to set up lexiscan. Pt agreeable.  Pt also states that insurance plan has changed and is concerned about whether lexiscan covered. Informed that message would be routed to billing to advise.

## 2018-08-22 NOTE — Telephone Encounter (Signed)
New Message    Pt is returning call from Friday    Please call

## 2018-08-25 ENCOUNTER — Other Ambulatory Visit: Payer: Self-pay

## 2018-08-25 ENCOUNTER — Ambulatory Visit (HOSPITAL_COMMUNITY): Payer: PPO | Attending: Cardiovascular Disease

## 2018-08-25 DIAGNOSIS — R079 Chest pain, unspecified: Secondary | ICD-10-CM | POA: Insufficient documentation

## 2018-08-25 LAB — MYOCARDIAL PERFUSION IMAGING
LV dias vol: 70 mL (ref 62–150)
LV sys vol: 28 mL
Peak HR: 87 {beats}/min
Rest HR: 55 {beats}/min
SDS: 1
SRS: 0
SSS: 1
TID: 0.88

## 2018-08-25 MED ORDER — TECHNETIUM TC 99M TETROFOSMIN IV KIT
8.7000 | PACK | Freq: Once | INTRAVENOUS | Status: AC | PRN
  Administered 2018-08-25: 8.7 via INTRAVENOUS
  Filled 2018-08-25: qty 9

## 2018-08-25 MED ORDER — REGADENOSON 0.4 MG/5ML IV SOLN
0.4000 mg | Freq: Once | INTRAVENOUS | Status: AC
  Administered 2018-08-25: 0.4 mg via INTRAVENOUS

## 2018-08-25 MED ORDER — TECHNETIUM TC 99M TETROFOSMIN IV KIT
26.1000 | PACK | Freq: Once | INTRAVENOUS | Status: AC | PRN
  Administered 2018-08-25: 10:00:00 26.1 via INTRAVENOUS
  Filled 2018-08-25: qty 27

## 2018-09-01 NOTE — Telephone Encounter (Signed)
Pt taking imdur as prescribed; lexiscan completed on 5/7

## 2018-09-14 DIAGNOSIS — Z7189 Other specified counseling: Secondary | ICD-10-CM | POA: Diagnosis not present

## 2018-09-14 DIAGNOSIS — I10 Essential (primary) hypertension: Secondary | ICD-10-CM | POA: Diagnosis not present

## 2018-09-16 ENCOUNTER — Other Ambulatory Visit: Payer: Self-pay

## 2018-09-16 NOTE — Patient Outreach (Signed)
[facility]) Care Management  09/16/2018  Kenneth Sanchez 05-05-1950 [id number]   TELEPHONE SCREENING Referral date: 09/14/18 Referral source: HTA / Health risk assessement Referral reason: disease management Insurance: Health team advantage  Telephone call to patient regarding health risk assessment referral. HIPAA verified with patient. RNCM introduced herself and explained reason for call. RNCM discussed and offered [facility] care management services. Patient verbally agreed.  Patient states he started having elevations in his blood pressure in January 2020. He reports his primary MD placed him on a new blood pressure medication which seem to be helping his blood pressure be in a more normal range. Patient reports today's blood pressure 138/70.  Patient states he monitors his blood pressure several times per week and takes his recorded readings to his doctor appointments.  Patient states his saw the physician assistant with his primary MD on 5/27/ 20 for a blood pressure follow up visit. Patient reports he is on a low salt diet.  Patient states he sees his primary MD yearly for a physical. Patient states he sees his cardiologist 1 time per year. He states he had coronary artery disease and had bypass surgery in early 2000.  Patient reports having a stress test done recently on 08/25/18. Patient states he saw his cardiologist April 2020.  He states he was having some chest discomfort and his doctor put him on a new heart medication. Patient states he is not sure if the chest discomfort was related to heart issues or his acid reflux.  Patient states he has not had the discomfort since being on the new medication.  RNCM offered to send patient EMMI education material for hypertension and coronary artery disease. Patient declined hypertension education material and agreed to information for coronary artery disease.   Patient verbally agrees to next telephone follow up with RNCM.    ASSESSMENT:  Patient will benefit from ongoing disease management for hypertension  PLAN:  RNCM will follow up with patient within 2 weeks  RNCM will send patient [facility] care management welcome packet/ consent RNCM will send patient EMMI education material regarding coronary artery disease.    George Ina RN,BSN,CCM [facility] Telephonic  [phone]

## 2018-09-23 ENCOUNTER — Ambulatory Visit: Payer: Self-pay

## 2018-09-30 ENCOUNTER — Other Ambulatory Visit: Payer: Self-pay

## 2018-09-30 NOTE — Patient Outreach (Signed)
[facility]) Care Management  09/30/2018  Kenneth Sanchez 1950/11/19 [id number]  TELEPHONE SCREENING Referral date: 09/14/18 Referral source: HTA / Health risk assessement Referral reason: disease management Insurance: Health team advantage  Attempt #1  Telephone call to patient regarding health risk assessment referral. Unable to reach.  HIPAA compliant voice message left with call back phone number.   PLAN; RNCM will attempt 2nd telephone call to patient within 4 business days.   Quinn Plowman RN,BSN,CCM [facility] Telephonic  [phone]

## 2018-10-06 ENCOUNTER — Other Ambulatory Visit: Payer: Self-pay

## 2018-10-06 NOTE — Patient Outreach (Signed)
[facility]) Care Management  10/06/2018  Kenneth Sanchez 12/06/1950 [id number]  TELEPHONE SCREENING Referral date:09/14/18 Referral source:HTA / Health risk assessement Referral reason:disease management Insurance:Health team advantage  Patient will be followed up for ongoing case management with [facility].   PLAN: RNCM will close case due to patient being enrolled in an external program.  RNCM will send patients primary MD of closure.   Quinn Plowman RN,BSN,CCM [facility] Telephonic  [phone]

## 2018-11-21 DIAGNOSIS — Z1159 Encounter for screening for other viral diseases: Secondary | ICD-10-CM | POA: Diagnosis not present

## 2018-11-21 DIAGNOSIS — I10 Essential (primary) hypertension: Secondary | ICD-10-CM | POA: Diagnosis not present

## 2018-11-21 DIAGNOSIS — Z125 Encounter for screening for malignant neoplasm of prostate: Secondary | ICD-10-CM | POA: Diagnosis not present

## 2018-11-21 DIAGNOSIS — Z0000 Encounter for general adult medical examination without abnormal findings: Secondary | ICD-10-CM | POA: Diagnosis not present

## 2018-11-21 DIAGNOSIS — E785 Hyperlipidemia, unspecified: Secondary | ICD-10-CM | POA: Diagnosis not present

## 2018-11-25 DIAGNOSIS — I6523 Occlusion and stenosis of bilateral carotid arteries: Secondary | ICD-10-CM | POA: Diagnosis not present

## 2018-11-25 DIAGNOSIS — Z23 Encounter for immunization: Secondary | ICD-10-CM | POA: Diagnosis not present

## 2018-11-25 DIAGNOSIS — E785 Hyperlipidemia, unspecified: Secondary | ICD-10-CM | POA: Diagnosis not present

## 2018-11-25 DIAGNOSIS — N529 Male erectile dysfunction, unspecified: Secondary | ICD-10-CM | POA: Diagnosis not present

## 2018-11-25 DIAGNOSIS — K219 Gastro-esophageal reflux disease without esophagitis: Secondary | ICD-10-CM | POA: Diagnosis not present

## 2018-11-25 DIAGNOSIS — I10 Essential (primary) hypertension: Secondary | ICD-10-CM | POA: Diagnosis not present

## 2018-11-25 DIAGNOSIS — Z0001 Encounter for general adult medical examination with abnormal findings: Secondary | ICD-10-CM | POA: Diagnosis not present

## 2018-11-25 DIAGNOSIS — Z7982 Long term (current) use of aspirin: Secondary | ICD-10-CM | POA: Diagnosis not present

## 2018-11-25 DIAGNOSIS — I2510 Atherosclerotic heart disease of native coronary artery without angina pectoris: Secondary | ICD-10-CM | POA: Diagnosis not present

## 2018-12-23 DIAGNOSIS — Z23 Encounter for immunization: Secondary | ICD-10-CM | POA: Diagnosis not present

## 2018-12-23 DIAGNOSIS — I2510 Atherosclerotic heart disease of native coronary artery without angina pectoris: Secondary | ICD-10-CM | POA: Diagnosis not present

## 2019-01-11 ENCOUNTER — Encounter: Payer: Self-pay | Admitting: Cardiovascular Disease

## 2019-02-10 ENCOUNTER — Other Ambulatory Visit: Payer: Self-pay

## 2019-02-10 ENCOUNTER — Ambulatory Visit (INDEPENDENT_AMBULATORY_CARE_PROVIDER_SITE_OTHER): Payer: PPO | Admitting: Cardiovascular Disease

## 2019-02-10 ENCOUNTER — Encounter: Payer: Self-pay | Admitting: Cardiovascular Disease

## 2019-02-10 DIAGNOSIS — I10 Essential (primary) hypertension: Secondary | ICD-10-CM | POA: Diagnosis not present

## 2019-02-10 DIAGNOSIS — I2583 Coronary atherosclerosis due to lipid rich plaque: Secondary | ICD-10-CM | POA: Diagnosis not present

## 2019-02-10 DIAGNOSIS — I2510 Atherosclerotic heart disease of native coronary artery without angina pectoris: Secondary | ICD-10-CM | POA: Diagnosis not present

## 2019-02-10 NOTE — Assessment & Plan Note (Signed)
History of CAD status post CABG x4 in January 2001.  He apparently had an abnormal GXT by Dr. Glade Lloyd who essentially performed cardiac catheterization revealing a nongrafted RCA with high-grade RCA lesion that was stented by Dr. Alla German with a drug-eluting stent (2.5 x 30 mm) 12/05/2004.  He was complain of some atypical chest pain back in April which led to a Myoview stress test 08/25/2018 which was entirely normal.  His pain subsequently resolved.

## 2019-02-10 NOTE — Progress Notes (Signed)
02/10/2019 [place] Manner   [birth year]  [id number]  Primary Physician Deland Pretty, MD Primary Cardiologist: Lorretta Harp MD Lupe Carney, [place]  HPI:  Kenneth Sanchez is a 68 y.o.  mildly overweight married Caucasian male, father of 52, grandfather to 3 grandchild, who works at at [employer]. I last saw him virtually on 07/27/2018. He has a history of CAD, status post coronary artery bypass grafting x4 in January 2001. He apparently had an abnormal Bruce-protocol exercise stress test with nonsustained ventricular tachycardia and was catheterized by Dr. Roe Rutherford revealing patent grafts with an ungrafted RCA and a high-grade RCA lesion, which was stented by Dr. Alla German with a drug-eluting stent (2.5 x 13 mm) December 05, 2004. His other problems include hypertension and hyperlipidemia. He does complain of some abdominal discomfort radiating to his back but denies chest pain or shortness of breath. His most recent lab work, performed in 37 apparently favorable but we are in the process of obtaining those results. I obtain a Myoview stress test on him August 2014 which was normal   Dr. Shelia Media follows his lipid profile performed 11/11/2017 revealing total cholesterol 110, LDL 46 and HDL of 44.  When I saw him 6 months ago he was complaining of some atypical chest pain.  Subsequent Myoview stress test performed 08/25/2018 was entirely normal.  He said no recurrent symptoms.  Current Meds  Medication Sig  . Ascorbic Acid (VITAMIN C) 1000 MG tablet Take 1,000 mg by mouth daily.  [name] aspirin 81 MG tablet Take 81 mg by mouth every evening.   [name] atorvastatin (LIPITOR) 40 MG tablet Take 40 mg by mouth at bedtime.   [name] ezetimibe (ZETIA) 10 MG tablet Take 10 mg by mouth daily.  . famotidine (PEPCID) 20 MG tablet Take 20 mg by mouth 2 (two) times daily.  . folic acid (FOLVITE) 767 MCG tablet Take 400 mcg by mouth every evening.   . hydrochlorothiazide (HYDRODIURIL) 12.5 MG  tablet Take 12.5 mg by mouth daily.  . isosorbide mononitrate (IMDUR) 30 MG 24 hr tablet Take 0.5 tablets (15 mg total) by mouth daily.  . metoprolol (LOPRESSOR) 50 MG tablet TAKE 1 & 1/2 (ONE AND ONE- HALF) TABLETS BY MOUTH TWICE A DAY AS DIRECTED (Patient taking differently: TAKE 1 & 1/2 (ONE AND ONE- HALF)= 75mg  TABLETS BY MOUTH TWICE A DAY AS DIRECTED)  . Multiple Vitamin (MULTIVITAMIN) capsule Take 1 capsule by mouth daily.  [name] NITROSTAT 0.4 MG SL tablet Take 0.4 mg by mouth every 5 (five) minutes as needed for chest pain (take up to 3 times daily).   . Omega-3 Fatty Acids (FISH OIL) 600 MG CAPS Take 2,400 mg by mouth daily.   . sildenafil (VIAGRA) 50 MG tablet Take 50 mg by mouth daily as needed for erectile dysfunction.  . [DISCONTINUED] ranitidine (ZANTAC) 150 MG capsule Take 150 mg by mouth daily.     No Known Allergies  Social History   Socioeconomic History  . Marital status: Married    Spouse name: Not on file  . Number of children: 3  . Years of education: Not on file  . Highest education level: Not on file  Occupational History  . 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 Financial resource strain: Not on file  . Food insecurity    Worry: Not on file    Inability: Not on file  . Transportation needs  Medical: Not on file    Non-medical: Not on file  Tobacco Use  . Smoking status: Never Smoker  . Smokeless tobacco: Never Used  Substance and Sexual Activity  . Alcohol use: No  . Drug use: No  . Sexual activity: Never  Lifestyle  . Physical activity    Days per week: Not on file    Minutes per session: Not on file  . Stress: Not on file  Relationships  . Social Musician on phone: Not on file    Gets together: Not on file    Attends religious service: Not on file    Active member of club or organization: Not on file    Attends meetings of clubs or organizations: Not on file    Relationship status: Not on file   . Intimate partner violence    Fear of current or ex partner: Not on file    Emotionally abused: Not on file    Physically abused: Not on file    Forced sexual activity: Not on file  Other Topics Concern  . Not on file  Social History Narrative  . Not on file     Review of Systems: General: negative for chills, fever, night sweats or weight changes.  Cardiovascular: negative for chest pain, dyspnea on exertion, edema, orthopnea, palpitations, paroxysmal nocturnal dyspnea or shortness of breath Dermatological: negative for rash Respiratory: negative for cough or wheezing Urologic: negative for hematuria Abdominal: negative for nausea, vomiting, diarrhea, bright red blood per rectum, melena, or hematemesis Neurologic: negative for visual changes, syncope, or dizziness All other systems reviewed and are otherwise negative except as noted above.    Blood pressure 137/87, pulse 68, temperature (!) 97.5 F (36.4 C), height 5\' 10"  (1.778 m), weight 187 lb 6.4 oz (85 kg), SpO2 97 %.  General appearance: alert and no distress Neck: no adenopathy, no carotid bruit, no JVD, supple, symmetrical, trachea midline and thyroid not enlarged, symmetric, no tenderness/mass/nodules Lungs: clear to auscultation bilaterally Heart: regular rate and rhythm, S1, S2 normal, no murmur, click, rub or gallop Extremities: extremities normal, atraumatic, no cyanosis or edema Pulses: 2+ and symmetric Skin: Skin color, texture, turgor normal. No rashes or lesions Neurologic: Alert and oriented X 3, normal strength and tone. Normal symmetric reflexes. Normal coordination and gait  EKG sinus rhythm at 65 with nonspecific ST and T wave changes.  I personally reviewed this EKG.  ASSESSMENT AND PLAN:   Essential hypertension History of essential hypertension with blood pressure measured today 137/87.  He is on metoprolol.  Hyperlipidemia History of hyperlipidemia on atorvastatin with lipid profile recently  performed by his PCP 12/23/2018 revealing total cholesterol 153, LDL 92 and HDL 41.  After this result he was started on Zetia as well with intention to follow lipid profile in the next several weeks by his PCP.  Coronary artery disease History of CAD status post CABG x4 in January 2001.  He apparently had an abnormal GXT by Dr. February 2001 who essentially performed cardiac catheterization revealing a nongrafted RCA with high-grade RCA lesion that was stented by Dr. Aleen Campi with a drug-eluting stent (2.5 x 30 mm) 12/05/2004.  He was complain of some atypical chest pain back in April which led to a Myoview stress test 08/25/2018 which was entirely normal.  His pain subsequently resolved.      10/25/2018 MD FACP,FACC,FAHA, [facility] 02/10/2019 9:26 AM

## 2019-02-10 NOTE — Assessment & Plan Note (Signed)
History of essential hypertension with blood pressure measured today 137/87.  He is on metoprolol.

## 2019-02-10 NOTE — Patient Instructions (Signed)
Medication Instructions:  Your physician recommends that you continue on your current medications as directed. Please refer to the Current Medication list given to you today.  If you need a refill on your cardiac medications before your next appointment, please call your pharmacy.   Lab work: none If you have labs (blood work) drawn today and your tests are completely normal, you will receive your results only by: [name] MyChart Message (if you have MyChart) OR . A paper copy in the mail If you have any lab test that is abnormal or we need to change your treatment, we will call you to review the results.  Testing/Procedures: none  Follow-Up: At [facility], you and your health needs are our priority.  As part of our continuing mission to provide you with exceptional heart care, we have created designated Provider Care Teams.  These Care Teams include your primary Cardiologist (physician) and Advanced Practice Providers (APPs -  Physician Assistants and Nurse Practitioners) who all work together to provide you with the care you need, when you need it. . You will need a follow up appointment in 6 months with an APP and in 12 months with Dr. Quay Burow.  Please call our office 2 months in advance to schedule each appointment.  You may see one of the following Advanced Practice Providers on your designated Care Team:   . Kerin Ransom, PA-C . Daleen Snook Kroeger, PA-C . Sande Rives, PA-C

## 2019-02-10 NOTE — Assessment & Plan Note (Signed)
History of hyperlipidemia on atorvastatin with lipid profile recently performed by his PCP 12/23/2018 revealing total cholesterol 153, LDL 92 and HDL 41.  After this result he was started on Zetia as well with intention to follow lipid profile in the next several weeks by his PCP.

## 2019-02-15 ENCOUNTER — Ambulatory Visit: Payer: PPO | Admitting: Cardiovascular Disease

## 2019-03-03 DIAGNOSIS — I2510 Atherosclerotic heart disease of native coronary artery without angina pectoris: Secondary | ICD-10-CM | POA: Diagnosis not present

## 2019-03-03 DIAGNOSIS — E785 Hyperlipidemia, unspecified: Secondary | ICD-10-CM | POA: Diagnosis not present

## 2019-03-30 ENCOUNTER — Other Ambulatory Visit: Payer: Self-pay | Admitting: *Deleted

## 2019-03-30 NOTE — Patient Outreach (Signed)
[facility]) Care Management  03/30/2019  SHYLER HOLZMAN 02-20-1951 [id number]   Case reviewed, no patient outreach needed,  and case closed per Bary Castilla, Assistant Clinical Director at [place]  request.   Colbert Coyer. Annia Friendly, BSN, [place] Management [facility] Telephonic CM Phone: [phone] Fax: [phone]

## 2019-05-22 DIAGNOSIS — E785 Hyperlipidemia, unspecified: Secondary | ICD-10-CM | POA: Diagnosis not present

## 2019-05-26 DIAGNOSIS — H6123 Impacted cerumen, bilateral: Secondary | ICD-10-CM | POA: Diagnosis not present

## 2019-05-26 DIAGNOSIS — N529 Male erectile dysfunction, unspecified: Secondary | ICD-10-CM | POA: Diagnosis not present

## 2019-05-26 DIAGNOSIS — I10 Essential (primary) hypertension: Secondary | ICD-10-CM | POA: Diagnosis not present

## 2019-05-26 DIAGNOSIS — K219 Gastro-esophageal reflux disease without esophagitis: Secondary | ICD-10-CM | POA: Diagnosis not present

## 2019-05-26 DIAGNOSIS — J302 Other seasonal allergic rhinitis: Secondary | ICD-10-CM | POA: Diagnosis not present

## 2019-05-26 DIAGNOSIS — I6523 Occlusion and stenosis of bilateral carotid arteries: Secondary | ICD-10-CM | POA: Diagnosis not present

## 2019-05-26 DIAGNOSIS — E785 Hyperlipidemia, unspecified: Secondary | ICD-10-CM | POA: Diagnosis not present

## 2019-05-26 DIAGNOSIS — I2510 Atherosclerotic heart disease of native coronary artery without angina pectoris: Secondary | ICD-10-CM | POA: Diagnosis not present

## 2019-06-24 ENCOUNTER — Ambulatory Visit: Payer: PPO | Attending: Internal Medicine

## 2019-06-24 DIAGNOSIS — Z23 Encounter for immunization: Secondary | ICD-10-CM

## 2019-06-24 NOTE — Progress Notes (Signed)
   Covid-19 Vaccination Clinic  Name:  Kenneth Sanchez    MRN: [id number] DOB: Jul 28, 1950  06/24/2019  [name] was observed post Covid-19 immunization for 15 minutes without incident. He was provided with Vaccine Information Sheet and instruction to access the V-Safe system.   [name] was instructed to call 911 with any severe reactions post vaccine: [name] Difficulty breathing  . Swelling of face and throat  . A fast heartbeat  . A bad rash all over body  . Dizziness and weakness   Immunizations Administered    Name Date Dose VIS Date Route   Pfizer COVID-19 Vaccine 06/24/2019  9:38 AM 0.3 mL 03/31/2019 Intramuscular   Manufacturer: [employer], [employer]   Lot: [id number]   NDC: [address]-0

## 2019-07-25 ENCOUNTER — Ambulatory Visit: Payer: PPO | Attending: Internal Medicine

## 2019-07-25 DIAGNOSIS — Z23 Encounter for immunization: Secondary | ICD-10-CM

## 2019-07-25 NOTE — Progress Notes (Signed)
   Covid-19 Vaccination Clinic  Name:  Kenneth Sanchez    MRN: [id number] DOB: 06-05-1950  07/25/2019  Kenneth Sanchez was observed post Covid-19 immunization for 15 minutes without incident. He was provided with Vaccine Information Sheet and instruction to access the V-Safe system.   Kenneth Sanchez was instructed to call 911 with any severe reactions post vaccine: [name] Difficulty breathing  . Swelling of face and throat  . A fast heartbeat  . A bad rash all over body  . Dizziness and weakness   Immunizations Administered    Name Date Dose VIS Date Route   Pfizer COVID-19 Vaccine 07/25/2019 12:15 PM 0.3 mL 03/31/2019 Intramuscular   Manufacturer: [employer], [employer]   Lot: [id number]   NDC: [address]-5

## 2019-08-09 ENCOUNTER — Other Ambulatory Visit: Payer: Self-pay

## 2019-08-09 ENCOUNTER — Ambulatory Visit: Payer: PPO | Admitting: Cardiology

## 2019-08-09 ENCOUNTER — Encounter: Payer: Self-pay | Admitting: Cardiology

## 2019-08-09 VITALS — BP 154/90 | HR 63 | Ht 71.0 in | Wt 195.8 lb

## 2019-08-09 DIAGNOSIS — I10 Essential (primary) hypertension: Secondary | ICD-10-CM | POA: Diagnosis not present

## 2019-08-09 DIAGNOSIS — Z9861 Coronary angioplasty status: Secondary | ICD-10-CM

## 2019-08-09 DIAGNOSIS — Z951 Presence of aortocoronary bypass graft: Secondary | ICD-10-CM

## 2019-08-09 DIAGNOSIS — E785 Hyperlipidemia, unspecified: Secondary | ICD-10-CM

## 2019-08-09 DIAGNOSIS — I2510 Atherosclerotic heart disease of native coronary artery without angina pectoris: Secondary | ICD-10-CM

## 2019-08-09 NOTE — Assessment & Plan Note (Signed)
CABG x 4 2001

## 2019-08-09 NOTE — Assessment & Plan Note (Signed)
Controlled at home per his readings

## 2019-08-09 NOTE — Progress Notes (Signed)
Cardiology Office Note:    Date:  08/09/2019   ID:  Kenneth Sanchez, DOB Jul 28, 1950, MRN [id number]  PCP:  Merri Brunette, MD  Cardiologist:  Dr Allyson Sabal Electrophysiologist:  None   Referring MD: Merri Brunette, MD   No chief complaint on file.   History of Present Illness:    Kenneth Sanchez is a 69 y.o. male with a hx of coronary disease, status post coronary artery bypass grafting x4 in 2001.  In 2006 he underwent catheterization and intervention with a DES to his native ungrafted RCA by Dr. Jenne Campus.  He is done well from a cardiac standpoint since.  He had low risk Myoview in 2014 and again in May 2020.  Other medical issues include treated hypertension and dyslipidemia.  His primary care providers following his lipids, his most recent LDL in February 2021 was 72 after Dr. Edrick Oh added Weldon Picking.  Patient is in the office today for routine follow-up.  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  He says he is active, he tries to walk 3 or 4 days a week.  He denies any chest pain or unusual shortness of breath.  He had no issues with his medication, he did ask if he needed to refill his Imdur 15 mg daily.  His blood pressure at home runs 1 15-1 30 systolic, 75-85 diastolic.  Past Medical History:  Diagnosis Date  . Abnormal stress test 2006   NSVT; had cardiac cath-12/05/04  . CAD (coronary artery disease)    CABG x4- 05/06/99; cath with PCI 12/05/2004  . Hyperlipemia   . Hypertension     Past Surgical History:  Procedure Laterality Date  . CORONARY ANGIOPLASTY WITH STENT PLACEMENT  12/05/2004   cypher stent 2.5x14mm to mid RCA, inflation to 14 atmospheres  . CORONARY ARTERY BYPASS GRAFT  05/06/99   x4; LIMA to distal LAD; RIMA to distal RCA, SVG to first diag branch, SVG to circ marginal branch    Current Medications: Current Meds  Medication Sig  . Ascorbic Acid (VITAMIN C) 1000 MG tablet Take 1,000 mg by mouth daily.  [name] aspirin 81 MG tablet  Take 81 mg by mouth every evening.   [name] atorvastatin (LIPITOR) 40 MG tablet Take 40 mg by mouth at bedtime.   [name] ezetimibe (ZETIA) 10 MG tablet Take 10 mg by mouth daily.  . famotidine (PEPCID) 20 MG tablet Take 20 mg by mouth 2 (two) times daily.  . folic acid (FOLVITE) 400 MCG tablet Take 400 mcg by mouth every evening.   . hydrochlorothiazide (HYDRODIURIL) 12.5 MG tablet Take 12.5 mg by mouth daily.  . metoprolol (LOPRESSOR) 50 MG tablet TAKE 1 & 1/2 (ONE AND ONE- HALF) TABLETS BY MOUTH TWICE A DAY AS DIRECTED (Patient taking differently: TAKE 1 & 1/2 (ONE AND ONE- HALF)= 75mg  TABLETS BY MOUTH TWICE A DAY AS DIRECTED)  . Multiple Vitamin (MULTIVITAMIN) capsule Take 1 capsule by mouth daily.  NITROSTAT 0.4 MG SL tablet Take 0.4 mg by mouth every 5 (five) minutes as needed for chest pain (take up to 3 times daily).   . Omega-3 Fatty Acids (FISH OIL) 600 MG CAPS Take 2,400 mg by mouth daily.   . sildenafil (VIAGRA) 50 MG tablet Take 50 mg by mouth daily as needed for erectile dysfunction.  . [DISCONTINUED] isosorbide mononitrate (IMDUR) 30 MG 24 hr tablet Take 0.5 tablets (15 mg total) by mouth daily.     Allergies:  Patient has no known allergies.   Social History   Socioeconomic History  . Marital status: Married    Spouse name: Not on file  . Number of children: 3  . Years of education: Not on file  . Highest education level: Not on file  Occupational History  . Occupation: Production designer, theatre/television/film    Employer: [employer]  Tobacco Use  . Smoking status: Never Smoker  . Smokeless tobacco: Never Used  Substance and Sexual Activity  . Alcohol use: No  . Drug use: No  . Sexual activity: Never  Other Topics Concern  . Not on file  Social History Narrative  . Not on file   Social Determinants of Health   Financial Resource Strain:   . Difficulty of Paying Living Expenses:   Food Insecurity:   . Worried About Programme researcher, broadcasting/film/video in the Last Year:   . Barista in the Last  Year:   Transportation Needs:   . Freight forwarder (Medical):   [name] Lack of Transportation (Non-Medical):   Physical Activity:   . Days of Exercise per Week:   . Minutes of Exercise per Session:   Stress:   . Feeling of Stress :   Social Connections:   . Frequency of Communication with Friends and Family:   . Frequency of Social Gatherings with Friends and Family:   . Attends Religious Services:   . Active Member of Clubs or Organizations:   . Attends Banker Meetings:   [name] Marital Status:      Family History: The patient's family history includes COPD in his father; Heart disease (age of onset: 62) in his mother.  ROS:   Please see the history of present illness.     All other systems reviewed and are negative.  EKGs/Labs/Other Studies Reviewed:    The following studies were reviewed today: Myoview May 2020  EKG:  EKG is ordered today.  The ekg ordered today demonstrates NSR- 63  Recent Labs: No results found for requested labs within last 8760 hours.  Recent Lipid Panel No results found for: CHOL, TRIG, HDL, CHOLHDL, VLDL, LDLCALC, LDLDIRECT  Physical Exam:    VS:  BP (!) 154/90   Pulse 63   Ht 5\' 11"  (1.803 m)   Wt 195 lb 12.8 oz (88.8 kg)   BMI 27.31 kg/m     Wt Readings from Last 3 Encounters:  08/09/19 195 lb 12.8 oz (88.8 kg)  02/10/19 187 lb 6.4 oz (85 kg)  07/27/18 187 lb (84.8 kg)     GEN:  Well nourished, well developed in no acute distress HEENT: Normal NECK: No JVD; No carotid bruits CARDIAC: RRR, no murmurs, rubs, gallops RESPIRATORY:  Clear to auscultation without rales, wheezing or rhonchi  ABDOMEN: Soft, non-tender, non-distended MUSCULOSKELETAL:  No edema; No deformity  SKIN: Warm and dry NEUROLOGIC:  Alert and oriented x 3 PSYCHIATRIC:  Normal affect   ASSESSMENT:    Hx of CABG CABG x 4 2001  CAD S/P percutaneous coronary angioplasty Native, un grafted, RCA PCI with DES 2006 Low risk Myoview 2014 and may  2020  Dyslipidemia, goal LDL below 70 Followed by PCP-LDL 72 Feb 2021 after the addition of Zetia  Essential hypertension Controlled at home per his readings  PLAN:    I told him I thought it would be OK for him to stop Imdur.  He knows to contact [phone] if he develops any chest pain.  F/U Dr [place]  in 6 months.    Medication Adjustments/Labs and Tests Ordered: Current medicines are reviewed at length with the patient today.  Concerns regarding medicines are outlined above.  Orders Placed This Encounter  Procedures  . EKG 12-Lead   No orders of the defined types were placed in this encounter.   Patient Instructions  Medication Instructions:  Stop Isosorbide   *If you need a refill on your cardiac medications before your next appointment, please call your pharmacy*  Follow-Up: At [facility], you and your health needs are our priority.  As part of our continuing mission to provide you with exceptional heart care, we have created designated Provider Care Teams.  These Care Teams include your primary Cardiologist (physician) and Advanced Practice Providers (APPs -  Physician Assistants and Nurse Practitioners) who all work together to provide you with the care you need, when you need it.  We recommend signing up for the patient portal called "MyChart".  Sign up information is provided on this After Visit Summary.  MyChart is used to connect with patients for Virtual Visits (Telemedicine).  Patients are able to view lab/test results, encounter notes, upcoming appointments, etc.  Non-urgent messages can be sent to your provider as well.   To learn more about what you can do with MyChart, go to [email or web address].    Your next appointment:   6 month(s)  The format for your next appointment:   In Person  Provider:   Quay Burow, MD        Signed, Kerin Ransom, PA-C  08/09/2019 2:10 PM    [place]

## 2019-08-09 NOTE — Patient Instructions (Addendum)
Medication Instructions:  Stop Isosorbide   *If you need a refill on your cardiac medications before your next appointment, please call your pharmacy*  Follow-Up: At [facility], you and your health needs are our priority.  As part of our continuing mission to provide you with exceptional heart care, we have created designated Provider Care Teams.  These Care Teams include your primary Cardiologist (physician) and Advanced Practice Providers (APPs -  Physician Assistants and Nurse Practitioners) who all work together to provide you with the care you need, when you need it.  We recommend signing up for the patient portal called "MyChart".  Sign up information is provided on this After Visit Summary.  MyChart is used to connect with patients for Virtual Visits (Telemedicine).  Patients are able to view lab/test results, encounter notes, upcoming appointments, etc.  Non-urgent messages can be sent to your provider as well.   To learn more about what you can do with MyChart, go to [email or web address].    Your next appointment:   6 month(s)  The format for your next appointment:   In Person  Provider:   Nanetta Batty, MD

## 2019-08-09 NOTE — Assessment & Plan Note (Signed)
Native, un grafted, RCA PCI with DES 2006 Low risk Myoview 2014 and may 2020

## 2019-08-09 NOTE — Assessment & Plan Note (Signed)
Followed by PCP-LDL 72 Feb 2021 after the addition of Zetia

## 2019-08-12 ENCOUNTER — Other Ambulatory Visit: Payer: Self-pay | Admitting: Cardiovascular Disease

## 2019-12-01 DIAGNOSIS — Z125 Encounter for screening for malignant neoplasm of prostate: Secondary | ICD-10-CM | POA: Diagnosis not present

## 2019-12-01 DIAGNOSIS — K219 Gastro-esophageal reflux disease without esophagitis: Secondary | ICD-10-CM | POA: Diagnosis not present

## 2019-12-01 DIAGNOSIS — I10 Essential (primary) hypertension: Secondary | ICD-10-CM | POA: Diagnosis not present

## 2019-12-07 DIAGNOSIS — Z86010 Personal history of colonic polyps: Secondary | ICD-10-CM | POA: Diagnosis not present

## 2019-12-07 DIAGNOSIS — I2510 Atherosclerotic heart disease of native coronary artery without angina pectoris: Secondary | ICD-10-CM | POA: Diagnosis not present

## 2019-12-07 DIAGNOSIS — K219 Gastro-esophageal reflux disease without esophagitis: Secondary | ICD-10-CM | POA: Diagnosis not present

## 2019-12-07 DIAGNOSIS — Z7982 Long term (current) use of aspirin: Secondary | ICD-10-CM | POA: Diagnosis not present

## 2019-12-07 DIAGNOSIS — N529 Male erectile dysfunction, unspecified: Secondary | ICD-10-CM | POA: Diagnosis not present

## 2019-12-07 DIAGNOSIS — N400 Enlarged prostate without lower urinary tract symptoms: Secondary | ICD-10-CM | POA: Diagnosis not present

## 2019-12-07 DIAGNOSIS — I10 Essential (primary) hypertension: Secondary | ICD-10-CM | POA: Diagnosis not present

## 2019-12-07 DIAGNOSIS — I6523 Occlusion and stenosis of bilateral carotid arteries: Secondary | ICD-10-CM | POA: Diagnosis not present

## 2019-12-07 DIAGNOSIS — Z0001 Encounter for general adult medical examination with abnormal findings: Secondary | ICD-10-CM | POA: Diagnosis not present

## 2020-02-23 ENCOUNTER — Other Ambulatory Visit: Payer: Self-pay

## 2020-02-23 ENCOUNTER — Ambulatory Visit: Payer: PPO | Admitting: Cardiovascular Disease

## 2020-02-23 ENCOUNTER — Encounter: Payer: Self-pay | Admitting: Cardiovascular Disease

## 2020-02-23 DIAGNOSIS — Z951 Presence of aortocoronary bypass graft: Secondary | ICD-10-CM | POA: Diagnosis not present

## 2020-02-23 DIAGNOSIS — E785 Hyperlipidemia, unspecified: Secondary | ICD-10-CM | POA: Diagnosis not present

## 2020-02-23 DIAGNOSIS — I10 Essential (primary) hypertension: Secondary | ICD-10-CM | POA: Diagnosis not present

## 2020-02-23 NOTE — Assessment & Plan Note (Signed)
History of essential hypertension a blood pressure measured today at 164/100.  He is on metoprolol and hydrochlorothiazide.  I reviewed his home blood pressure log and for the most part his blood pressures are adequately controlled.

## 2020-02-23 NOTE — Assessment & Plan Note (Addendum)
History of CAD status post CABG times 24 April 1999.  He underwent cardiac catheterization by Dr. Charolette Child after an abnormal stress test remotely that showed patent grafts with an ungrafted RCA and a high-grade RCA lesion which was ultimately stented by Dr. Shirlee Latch with a 2.5 mm x 30 mm long drug-eluting stent 12/05/2004.  He said no recurrent symptoms.  He did have a stress test performed 08/25/2018 which was low risk and nonischemic.  He is currently asymptomatic.

## 2020-02-23 NOTE — Patient Instructions (Signed)
Follow-Up: At [facility], you and your health needs are our priority.  As part of our continuing mission to provide you with exceptional heart care, we have created designated Provider Care Teams.  These Care Teams include your primary Cardiologist (physician) and Advanced Practice Providers (APPs -  Physician Assistants and Nurse Practitioners) who all work together to provide you with the care you need, when you need it.  We recommend signing up for the patient portal called "MyChart".  Sign up information is provided on this After Visit Summary.  MyChart is used to connect with patients for Virtual Visits (Telemedicine).  Patients are able to view lab/test results, encounter notes, upcoming appointments, etc.  Non-urgent messages can be sent to your provider as well.   To learn more about what you can do with MyChart, go to [email or web address].    Your next appointment:   6 month(s)  The format for your next appointment:   In Person  Provider:   You will see one of the following Advanced Practice Providers on your designated Care Team:    Luke Kilroy, PA-C  Callie Goodrich, PA-C  Jesse Cleaver, FNP  Then, Jonathan Berry, MD will plan to see you again in 12 month(s).  

## 2020-02-23 NOTE — Progress Notes (Signed)
02/23/2020 Dante Roudebush Geil   [birth year]  [id number]  Primary Physician Merri Brunette, MD Primary Cardiologist: Runell Gess MD Nicholes Calamity, [place]  HPI:  Kenneth Sanchez is a 69 y.o.  mildly overweight married Caucasian male, father of 3, grandfather to 3 grandchild, who works at at [employer]. I last saw him virtually on 02/10/2019. He has a history of CAD, status post coronary artery bypass grafting x4 in January 2001. He apparently had an abnormal Bruce-protocol exercise stress test with nonsustained ventricular tachycardia and was catheterized by Dr. Charolette Child revealing patent grafts with an ungrafted RCA and a high-grade RCA lesion, which was stented by Dr. Lenise Herald with a drug-eluting stent (2.5 x 13 mm) December 05, 2004. His other problems include hypertension and hyperlipidemia. He does complain of some abdominal discomfort radiating to his back but denies chest pain or shortness of breath. His most recent lab work, performed in May,wwas apparently favorable but we are in the process of obtaining those results. I obtain a Myoview stress test on him August 2014 which was normal  Dr.Pharrfollows his lipid profile performed 12/01/2019 revealing total cholesterol of 128, LDL 70 and HDL 41.  Since I saw him a year ago he is done well.  He did have a Myoview performed 08/25/2018 which was nonischemic.  He denies chest pain or shortness of breath.   Current Meds  Medication Sig  . Ascorbic Acid (VITAMIN C) 1000 MG tablet Take 1,000 mg by mouth daily.  [name] aspirin 81 MG tablet Take 81 mg by mouth every evening.   [name] atorvastatin (LIPITOR) 40 MG tablet Take 40 mg by mouth at bedtime.   [name] ezetimibe (ZETIA) 10 MG tablet Take 10 mg by mouth daily.  . famotidine (PEPCID) 20 MG tablet Take 20 mg by mouth 2 (two) times daily.  . folic acid (FOLVITE) 400 MCG tablet Take 400 mcg by mouth every evening.   . hydrochlorothiazide (HYDRODIURIL) 12.5 MG tablet Take 12.5 mg by  mouth daily.  . metoprolol (LOPRESSOR) 50 MG tablet TAKE 1 & 1/2 (ONE AND ONE- HALF) TABLETS BY MOUTH TWICE A DAY AS DIRECTED (Patient taking differently: TAKE 1 & 1/2 (ONE AND ONE- HALF)= 75mg  TABLETS BY MOUTH TWICE A DAY AS DIRECTED)  . Multiple Vitamin (MULTIVITAMIN) capsule Take 1 capsule by mouth daily.  NITROSTAT 0.4 MG SL tablet Take 0.4 mg by mouth every 5 (five) minutes as needed for chest pain (take up to 3 times daily).   . Omega-3 Fatty Acids (FISH OIL) 600 MG CAPS Take 2,400 mg by mouth daily.   . sildenafil (VIAGRA) 50 MG tablet Take 50 mg by mouth daily as needed for erectile dysfunction.     No Known Allergies  Social History   Socioeconomic History  . Marital status: Married    Spouse name: Not on file  . Number of children: 3  . Years of education: Not on file  . Highest education level: Not on file  Occupational History  . Occupation: [name]    Employer: [employer]  Tobacco Use  . Smoking status: Never Smoker  . Smokeless tobacco: Never Used  Substance and Sexual Activity  . Alcohol use: No  . Drug use: No  . Sexual activity: Never  Other Topics Concern  . Not on file  Social History Narrative  . Not on file   Social Determinants of Health   Financial Resource Strain:   . Difficulty of Paying  Living Expenses: Not on file  Food Insecurity:   . Worried About Programme researcher, broadcasting/film/video in the Last Year: Not on file  . Ran Out of Food in the Last Year: Not on file  Transportation Needs:   . Lack of Transportation (Medical): Not on file  . Lack of Transportation (Non-Medical): Not on file  Physical Activity:   . Days of Exercise per Week: Not on file  . Minutes of Exercise per Session: Not on file  Stress:   . Feeling of Stress : Not on file  Social Connections:   . Frequency of Communication with Friends and Family: Not on file  . Frequency of Social Gatherings with Friends and Family: Not on file  . Attends Religious Services: Not on file    . Active Member of Clubs or Organizations: Not on file  . Attends Banker Meetings: Not on file  . Marital Status: Not on file  Intimate Partner Violence:   . Fear of Current or Ex-Partner: Not on file  . Emotionally Abused: Not on file  . Physically Abused: Not on file  . Sexually Abused: Not on file     Review of Systems: General: negative for chills, fever, night sweats or weight changes.  Cardiovascular: negative for chest pain, dyspnea on exertion, edema, orthopnea, palpitations, paroxysmal nocturnal dyspnea or shortness of breath Dermatological: negative for rash Respiratory: negative for cough or wheezing Urologic: negative for hematuria Abdominal: negative for nausea, vomiting, diarrhea, bright red blood per rectum, melena, or hematemesis Neurologic: negative for visual changes, syncope, or dizziness All other systems reviewed and are otherwise negative except as noted above.    Blood pressure (!) 164/100, pulse 83, height 5\' 10"  (1.778 m), weight 187 lb (84.8 kg), SpO2 98 %.  General appearance: alert and no distress Neck: no adenopathy, no carotid bruit, no JVD, supple, symmetrical, trachea midline and thyroid not enlarged, symmetric, no tenderness/mass/nodules Lungs: clear to auscultation bilaterally Heart: regular rate and rhythm, S1, S2 normal, no murmur, click, rub or gallop Extremities: extremities normal, atraumatic, no cyanosis or edema Pulses: 2+ and symmetric Skin: Skin color, texture, turgor normal. No rashes or lesions Neurologic: Alert and oriented X 3, normal strength and tone. Normal symmetric reflexes. Normal coordination and gait  EKG not performed today  ASSESSMENT AND PLAN:   Essential hypertension History of essential hypertension a blood pressure measured today at 164/100.  He is on metoprolol and hydrochlorothiazide.  I reviewed his home blood pressure log and for the most part his blood pressures are adequately  controlled.  Dyslipidemia, goal LDL below 70 History of dyslipidemia on statin therapy with lipid profile performed most recently by his PCP 12/01/2019 revealing total cholesterol 128, LDL 70 and HDL 41.  Hx of CABG History of CAD status post CABG times 24 April 1999.  He underwent cardiac catheterization by Dr. 26 April 1999 after an abnormal stress test remotely that showed patent grafts with an ungrafted RCA and a high-grade RCA lesion which was ultimately stented by Dr. Charolette Child with a 2.5 mm x 30 mm long drug-eluting stent 12/05/2004.  He said no recurrent symptoms.  He did have a stress test performed 08/25/2018 which was low risk and nonischemic.  He is currently asymptomatic.      10/25/2018 MD FACP,FACC,FAHA, [facility] 02/23/2020 8:56 AM

## 2020-02-23 NOTE — Assessment & Plan Note (Signed)
History of dyslipidemia on statin therapy with lipid profile performed most recently by his PCP 12/01/2019 revealing total cholesterol 128, LDL 70 and HDL 41.

## 2020-03-02 ENCOUNTER — Ambulatory Visit: Payer: PPO | Attending: Internal Medicine

## 2020-03-02 ENCOUNTER — Other Ambulatory Visit: Payer: Self-pay

## 2020-03-02 DIAGNOSIS — Z23 Encounter for immunization: Secondary | ICD-10-CM

## 2020-03-02 NOTE — Progress Notes (Signed)
   Covid-19 Vaccination Clinic  Name:  Kenneth Sanchez    MRN: [id number] DOB: 09-Nov-1950  03/02/2020  [name] was observed post Covid-19 immunization for 15 minutes without incident. He was provided with Vaccine Information Sheet and instruction to access the V-Safe system.   [name] was instructed to call 911 with any severe reactions post vaccine: [name] Difficulty breathing  . Swelling of face and throat  . A fast heartbeat  . A bad rash all over body  . Dizziness and weakness   Immunizations Administered    Name Date Dose VIS Date Route   Pfizer COVID-19 Vaccine 03/02/2020  3:10 PM 0.3 mL 02/07/2020 Intramuscular   Manufacturer: [employer], [employer]   Lot: [id number]   NDC: [address]-2

## 2020-07-15 DIAGNOSIS — I10 Essential (primary) hypertension: Secondary | ICD-10-CM | POA: Diagnosis not present

## 2020-07-15 DIAGNOSIS — K219 Gastro-esophageal reflux disease without esophagitis: Secondary | ICD-10-CM | POA: Diagnosis not present

## 2020-07-26 ENCOUNTER — Telehealth: Payer: Self-pay | Admitting: Cardiovascular Disease

## 2020-07-26 NOTE — Telephone Encounter (Signed)
Returned the call to the patient. He stated that over the past week he has been having heart rates in the 50's and 60's. This morning his heart rate was 53 with a blood pressure of 141/92.This afternoon heart rate was 68 and blood pressure was 127/87.   He currently takes: Metoprolol 75 mg twice daily HCTZ 12.5 mg once daily  The patient is currently asymptomatic. He has been advised to keep a log of his pressures and heart rates and to bring them to his appointment on 08/23/20. If his heart rate gets lower and/or he becomes symptomatic then he has been advised to call back.

## 2020-07-26 NOTE — Telephone Encounter (Signed)
STAT if HR is under 50 or over 120 (normal HR is 60-100 beats per minute)  1) What is your heart rate? Was 62 about 30 minutes ago  2) Do you have a log of your heart rate readings (document readings)? Has been going in 50's, today 51 concerned him, 58  3) Do you have any other symptoms? States he sometimes feels like he needs a deep breath but does not get SOB   Patient states his HR has been going into the 50's and he would like to know if this is normal.

## 2020-08-06 DIAGNOSIS — I10 Essential (primary) hypertension: Secondary | ICD-10-CM | POA: Diagnosis not present

## 2020-08-06 DIAGNOSIS — K219 Gastro-esophageal reflux disease without esophagitis: Secondary | ICD-10-CM | POA: Diagnosis not present

## 2020-08-09 NOTE — Progress Notes (Signed)
Cardiology Office Note:    Date:  08/23/2020   ID:  Kenneth Sanchez, DOB July 15, 1950, MRN [id number]  PCP:  Merri Brunette, MD  Cardiologist:  Nanetta Batty, MD   Referring MD: Merri Brunette, MD   Chief Complaint  Patient presents with  . Follow-up    Bradycardia, CAD    History of Present Illness:    Kenneth Sanchez is a 70 y.o. male with a hx of CAD s/p CABG x 24 Apr 1999. He had an abnormal exercise stress test that showed NSVT. Heart cath showed high grade lesion in the RCA treated with DES 12/05/04.  Nuclear stress test 11/2012 and 08/25/18 were both nonischemic.  He last saw Dr. Allyson Sabal in clinic on 02/23/20 and was doing well at that time. No medication changes, no angina. Lipid panel 11/2019 with total cholesterol 128, LDL 70, and HDL 41.   He called or office with HR in the 50-60s and SBP in the 120-140s. He was asymptomatic. He was instructed to keep a log of HR and BP for our appointment today.   He presents for follow up. He is here alone. He brings a BP log that shows BP 122-136/79-94. He recounts one day of lower HR in the 50s, but has not recurred. He was not lightheaded, dizzy, or pre-syncopal. He is retired but still works 3 days per week in [employer] center, he does not have chest pain with exertion. He sometimes has chest discomfort when moving a particular direction consistent with prior sternotomy. Overall, doing very well. His only complaint today is pain in leg when walking, no pain at rest.     Past Medical History:  Diagnosis Date  . Abnormal stress test 2006   NSVT; had cardiac cath-12/05/04  . CAD (coronary artery disease)    CABG x4- 05/06/99; cath with PCI 12/05/2004  . Hyperlipemia   . Hypertension     Past Surgical History:  Procedure Laterality Date  . CORONARY ANGIOPLASTY WITH STENT PLACEMENT  12/05/2004   cypher stent 2.5x39mm to mid RCA, inflation to 14 atmospheres  . CORONARY ARTERY BYPASS GRAFT  05/06/99   x4; LIMA to distal LAD; RIMA to distal  RCA, SVG to first diag branch, SVG to circ marginal branch    Current Medications: Current Meds  Medication Sig  . Ascorbic Acid (VITAMIN C) 1000 MG tablet Take 1,000 mg by mouth daily.  [name] aspirin 81 MG tablet Take 81 mg by mouth every evening.   [name] atorvastatin (LIPITOR) 40 MG tablet Take 40 mg by mouth at bedtime.   [name] ezetimibe (ZETIA) 10 MG tablet Take 10 mg by mouth daily.  . famotidine (PEPCID) 20 MG tablet Take 20 mg by mouth 2 (two) times daily.  . folic acid (FOLVITE) 400 MCG tablet Take 400 mcg by mouth every evening.   . hydrochlorothiazide (HYDRODIURIL) 12.5 MG tablet Take 12.5 mg by mouth daily.  . metoprolol tartrate (LOPRESSOR) 50 MG tablet Take 50 mg by mouth 2 (two) times daily. Take 1.5 Tablets Twice Daily as Directed  . Multiple Vitamin (MULTIVITAMIN) capsule Take 1 capsule by mouth daily.  [name] NITROSTAT 0.4 MG SL tablet Take 0.4 mg by mouth every 5 (five) minutes as needed for chest pain (take up to 3 times daily).   . Omega-3 Fatty Acids (FISH OIL) 600 MG CAPS Take 2,400 mg by mouth daily.   . pantoprazole (PROTONIX) 40 MG tablet take 1 tablet by mouth every day for 30 days  . sildenafil (  VIAGRA) 50 MG tablet Take 50 mg by mouth daily as needed for erectile dysfunction.     Allergies:   Patient has no known allergies.   Social History   Socioeconomic History  . Marital status: Married    Spouse name: Not on file  . Number of children: 3  . Years of education: Not on file  . Highest education level: Not on file  Occupational History  . Occupation: Production designer, theatre/television/film    Employer: [employer]  Tobacco Use  . Smoking status: Never Smoker  . Smokeless tobacco: Never Used  Substance and Sexual Activity  . Alcohol use: No  . Drug use: No  . Sexual activity: Never  Other Topics Concern  . Not on file  Social History Narrative  . Not on file   Social Determinants of Health   Financial Resource Strain: Not on file  Food Insecurity: Not on file   Transportation Needs: Not on file  Physical Activity: Not on file  Stress: Not on file  Social Connections: Not on file     Family History: The patient's family history includes COPD in his father; Heart disease (age of onset: 49) in his mother.  ROS:   Please see the history of present illness.     All other systems reviewed and are negative.  EKGs/Labs/Other Studies Reviewed:    The following studies were reviewed today:  NST 08/2018:  Nuclear stress EF: 60%. The left ventricular ejection fraction is normal (55-65%).  This is a low risk study. No evidence of ischemia. No evidence of infarction  The study is normal.     EKG:  EKG is ordered today.  The ekg ordered today demonstrates sinus rhythm HR 60  Recent Labs: No results found for requested labs within last 8760 hours.  Recent Lipid Panel No results found for: CHOL, TRIG, HDL, CHOLHDL, VLDL, LDLCALC, LDLDIRECT  Physical Exam:    VS:  BP (!) 160/90   Pulse 68   Ht 5\' 10"  (1.778 m)   Wt 197 lb (89.4 kg)   SpO2 99%   BMI 28.27 kg/m     Wt Readings from Last 3 Encounters:  08/23/20 197 lb (89.4 kg)  02/23/20 187 lb (84.8 kg)  08/09/19 195 lb 12.8 oz (88.8 kg)     GEN:  Well nourished, well developed in no acute distress HEENT: Normal NECK: No JVD; No carotid bruits LYMPHATICS: No lymphadenopathy CARDIAC: RRR, no murmurs, rubs, gallops, sternotomy scar RESPIRATORY:  Clear to auscultation without rales, wheezing or rhonchi  ABDOMEN: Soft, non-tender, non-distended MUSCULOSKELETAL:  No edema; No deformity  SKIN: Warm and dry NEUROLOGIC:  Alert and oriented x 3 PSYCHIATRIC:  Normal affect   ASSESSMENT:    1. Coronary artery disease due to lipid rich plaque   2. Hx of CABG   3. CAD S/P percutaneous coronary angioplasty   4. Bradycardia   5. Pain in both lower extremities   6. Essential hypertension   7. Dyslipidemia, goal LDL below 70    PLAN:    In order of problems listed  above:  Bradycardia - currently on 75 mg lopressor BID - HR 68 in clinic today - isolated incident of HR in the 50s, asymptomatic - no medication changes at this time - we discussed when to call our office - if this occurs again with symptoms, may consider reducing to 50 mg BID lopressor vs switching to coreg   Pain with walking - ?claudication - no pain at  rest - I will collect ABIs with reflex arterial duplex   CAD s/p CABG x 4 with subsequent DES to RCA - last ischemic evaluation in 2020 - normal nuclear stress test - continue ASA, 40 mg lipitor, BB - doing well off of imdur, no angina - occasional MSK pain related to prior sternotomy   Hypertension - on 12.5 mg HCTZ, BB - well-controlled at home - white coat hypertension - no medication changes at this time   Hyperlipidemia with LDL goal < 70 - LDL 70 12/01/19: Total chol: 128 HDL: 41 LDL: 70 Trig: 86 - continue 40 mg lipitor, zetia, fish oil - will check lipids with PCP in the next few months    Medication Adjustments/Labs and Tests Ordered: Current medicines are reviewed at length with the patient today.  Concerns regarding medicines are outlined above.  No orders of the defined types were placed in this encounter.  No orders of the defined types were placed in this encounter.   Signed, Marcelino Duster, [place]  08/23/2020 8:49 AM    [facility]

## 2020-08-23 ENCOUNTER — Ambulatory Visit: Payer: PPO | Admitting: Physician Assistant

## 2020-08-23 ENCOUNTER — Encounter: Payer: Self-pay | Admitting: Physician Assistant

## 2020-08-23 ENCOUNTER — Other Ambulatory Visit: Payer: Self-pay

## 2020-08-23 VITALS — BP 160/90 | HR 68 | Ht 70.0 in | Wt 197.0 lb

## 2020-08-23 DIAGNOSIS — I2583 Coronary atherosclerosis due to lipid rich plaque: Secondary | ICD-10-CM

## 2020-08-23 DIAGNOSIS — I10 Essential (primary) hypertension: Secondary | ICD-10-CM | POA: Diagnosis not present

## 2020-08-23 DIAGNOSIS — E785 Hyperlipidemia, unspecified: Secondary | ICD-10-CM | POA: Diagnosis not present

## 2020-08-23 DIAGNOSIS — R001 Bradycardia, unspecified: Secondary | ICD-10-CM

## 2020-08-23 DIAGNOSIS — I2510 Atherosclerotic heart disease of native coronary artery without angina pectoris: Secondary | ICD-10-CM

## 2020-08-23 DIAGNOSIS — M79605 Pain in left leg: Secondary | ICD-10-CM | POA: Diagnosis not present

## 2020-08-23 DIAGNOSIS — M79604 Pain in right leg: Secondary | ICD-10-CM

## 2020-08-23 DIAGNOSIS — Z9861 Coronary angioplasty status: Secondary | ICD-10-CM | POA: Diagnosis not present

## 2020-08-23 DIAGNOSIS — Z951 Presence of aortocoronary bypass graft: Secondary | ICD-10-CM | POA: Diagnosis not present

## 2020-08-23 NOTE — Patient Instructions (Signed)
Medication Instructions:  No Changes *If you need a refill on your cardiac medications before your next appointment, please call your pharmacy*   Lab Work: No Labs If you have labs (blood work) drawn today and your tests are completely normal, you will receive your results only by: [name] MyChart Message (if you have MyChart) OR . A paper copy in the mail If you have any lab test that is abnormal or we need to change your treatment, we will call you to review the results.   Testing/Procedures: [address], [address] Your physician has requested that you have an ankle brachial index (ABI). During this test an ultrasound and blood pressure cuff are used to evaluate the arteries that supply the arms and legs with blood. Allow thirty minutes for this exam. There are no restrictions or special instructions.    Follow-Up: At [facility], you and your health needs are our priority.  As part of our continuing mission to provide you with exceptional heart care, we have created designated Provider Care Teams.  These Care Teams include your primary Cardiologist (physician) and Advanced Practice Providers (APPs -  Physician Assistants and Nurse Practitioners) who all work together to provide you with the care you need, when you need it.   Your next appointment:   6 month(s)  The format for your next appointment:   In Person  Provider:   Nanetta Batty, MD

## 2020-08-28 ENCOUNTER — Other Ambulatory Visit: Payer: Self-pay | Admitting: Physician Assistant

## 2020-08-28 DIAGNOSIS — M79604 Pain in right leg: Secondary | ICD-10-CM

## 2020-08-28 DIAGNOSIS — M79605 Pain in left leg: Secondary | ICD-10-CM

## 2020-08-29 ENCOUNTER — Other Ambulatory Visit: Payer: Self-pay

## 2020-08-29 ENCOUNTER — Ambulatory Visit (HOSPITAL_COMMUNITY)
Admission: RE | Admit: 2020-08-29 | Discharge: 2020-08-29 | Disposition: A | Discharge status: Home / Self Care | Payer: PPO | Source: Ambulatory Visit | Attending: Cardiology | Admitting: Cardiology

## 2020-08-29 DIAGNOSIS — M79605 Pain in left leg: Secondary | ICD-10-CM | POA: Insufficient documentation

## 2020-08-29 DIAGNOSIS — M79604 Pain in right leg: Secondary | ICD-10-CM | POA: Diagnosis not present

## 2020-09-10 ENCOUNTER — Encounter: Payer: Self-pay | Admitting: *Deleted

## 2020-12-06 DIAGNOSIS — I10 Essential (primary) hypertension: Secondary | ICD-10-CM | POA: Diagnosis not present

## 2020-12-06 DIAGNOSIS — Z125 Encounter for screening for malignant neoplasm of prostate: Secondary | ICD-10-CM | POA: Diagnosis not present

## 2020-12-12 DIAGNOSIS — M25562 Pain in left knee: Secondary | ICD-10-CM | POA: Diagnosis not present

## 2020-12-12 DIAGNOSIS — N529 Male erectile dysfunction, unspecified: Secondary | ICD-10-CM | POA: Diagnosis not present

## 2020-12-12 DIAGNOSIS — K219 Gastro-esophageal reflux disease without esophagitis: Secondary | ICD-10-CM | POA: Diagnosis not present

## 2020-12-12 DIAGNOSIS — I6523 Occlusion and stenosis of bilateral carotid arteries: Secondary | ICD-10-CM | POA: Diagnosis not present

## 2020-12-12 DIAGNOSIS — I2510 Atherosclerotic heart disease of native coronary artery without angina pectoris: Secondary | ICD-10-CM | POA: Diagnosis not present

## 2020-12-12 DIAGNOSIS — I10 Essential (primary) hypertension: Secondary | ICD-10-CM | POA: Diagnosis not present

## 2020-12-12 DIAGNOSIS — J309 Allergic rhinitis, unspecified: Secondary | ICD-10-CM | POA: Diagnosis not present

## 2020-12-12 DIAGNOSIS — Z0000 Encounter for general adult medical examination without abnormal findings: Secondary | ICD-10-CM | POA: Diagnosis not present

## 2020-12-12 DIAGNOSIS — G8929 Other chronic pain: Secondary | ICD-10-CM | POA: Diagnosis not present

## 2021-02-21 ENCOUNTER — Ambulatory Visit: Payer: PPO | Admitting: Cardiovascular Disease

## 2021-02-21 ENCOUNTER — Other Ambulatory Visit: Payer: Self-pay

## 2021-02-21 ENCOUNTER — Encounter: Payer: Self-pay | Admitting: Cardiovascular Disease

## 2021-02-21 DIAGNOSIS — Z951 Presence of aortocoronary bypass graft: Secondary | ICD-10-CM | POA: Diagnosis not present

## 2021-02-21 DIAGNOSIS — I10 Essential (primary) hypertension: Secondary | ICD-10-CM | POA: Diagnosis not present

## 2021-02-21 DIAGNOSIS — E785 Hyperlipidemia, unspecified: Secondary | ICD-10-CM | POA: Diagnosis not present

## 2021-02-21 NOTE — Assessment & Plan Note (Signed)
History of CAD status post coronary artery bypass grafting x4 by Dr. Cornelius Moras January 2001.  He underwent cardiac catheterization by Dr. Charolette Child after an abnormal stress test that showed patent grafts with a nongrafted RCA and high-grade RCA lesion which ultimately was stented by Dr. Jenne Campus with a 2.5 mm x 30 mm long drug-eluting stent 12/05/2004.  He had a stress test performed 08/25/2018 that was nonischemic.  He denies chest pain.

## 2021-02-21 NOTE — Assessment & Plan Note (Signed)
History of essential hypertension with blood pressure measured today in the office of 160/90.  He did show me a blood pressure log that showed blood pressure in the 130/80 range fairly consistently.  He is on hydrochlorothiazide and metoprolol.

## 2021-02-21 NOTE — Patient Instructions (Signed)

## 2021-02-21 NOTE — Progress Notes (Signed)
02/21/2021 PLUMMER LAPRADE   20-Jun-1950  [id number]  Primary Physician Deland Pretty, MD Primary Cardiologist: Lorretta Harp MD Lupe Carney, [place]  HPI:  Kenneth Sanchez is a 70 y.o.   mildly overweight married Caucasian male, father of 54, grandfather to 3 grandchild, who works at  at [employer]. I last saw him virtually on 02/23/2020. He has a history of CAD, status post coronary artery bypass grafting x4 in January 2001. He apparently had an abnormal Bruce-protocol exercise stress test with nonsustained ventricular tachycardia and was catheterized by Dr. Roe Rutherford revealing patent grafts with an ungrafted RCA and a high-grade RCA lesion, which was stented by Dr. Alla German with a drug-eluting stent (2.5 x 13 mm) December 05, 2004. His other problems include hypertension and hyperlipidemia. He does complain of some abdominal discomfort radiating to his back but denies chest pain or shortness of breath. His most recent lab work, performed in 89 apparently favorable but we are in the process of obtaining those results. I obtain a Myoview stress test on him August 2014 which was normal     Dr. Shelia Media follows his lipid profile performed 12/06/2020 revealing total cholesterol 119, LDL 64 and HDL 40.Since I saw him a year ago he is done well.  He did have a Myoview performed 08/25/2018 which was nonischemic.  He denies chest pain or shortness of breath.   Current Meds  Medication Sig   Ascorbic Acid (VITAMIN C) 1000 MG tablet Take 1,000 mg by mouth daily.   aspirin 81 MG tablet Take 81 mg by mouth every evening.    atorvastatin (LIPITOR) 40 MG tablet Take 40 mg by mouth at bedtime.    ezetimibe (ZETIA) 10 MG tablet Take 10 mg by mouth daily.   famotidine (PEPCID) 20 MG tablet Take 20 mg by mouth 2 (two) times daily.   folic acid (FOLVITE) A999333 MCG tablet Take 400 mcg by mouth every evening.    hydrochlorothiazide (HYDRODIURIL) 12.5 MG tablet Take 12.5 mg by mouth daily.    metoprolol tartrate (LOPRESSOR) 50 MG tablet Take 50 mg by mouth 2 (two) times daily. Take 1.5 Tablets Twice Daily as Directed   Multiple Vitamin (MULTIVITAMIN) capsule Take 1 capsule by mouth daily.   NITROSTAT 0.4 MG SL tablet Take 0.4 mg by mouth every 5 (five) minutes as needed for chest pain (take up to 3 times daily).    Omega-3 Fatty Acids (FISH OIL) 600 MG CAPS Take 2,400 mg by mouth daily.    OVER THE COUNTER MEDICATION every other day. Beet Root   sildenafil (VIAGRA) 50 MG tablet Take 50 mg by mouth daily as needed for erectile dysfunction.     No Known Allergies  Social History   Socioeconomic History   Marital status: Married    Spouse name: Not on file   Number of children: 3   Years of education: Not on file   Highest education level: Not on file  Occupational History   Occupation: Patent attorney    Employer: [employer]  Tobacco Use   Smoking status: Never   Smokeless tobacco: Never  Substance and Sexual Activity   Alcohol use: No   Drug use: No   Sexual activity: Never  Other Topics Concern   Not on file  Social History Narrative   Not on file   Social Determinants of Health   Financial Resource Strain: Not on file  Food Insecurity: Not on file  Transportation Needs:  Not on file  Physical Activity: Not on file  Stress: Not on file  Social Connections: Not on file  Intimate Partner Violence: Not on file     Review of Systems: General: negative for chills, fever, night sweats or weight changes.  Cardiovascular: negative for chest pain, dyspnea on exertion, edema, orthopnea, palpitations, paroxysmal nocturnal dyspnea or shortness of breath Dermatological: negative for rash Respiratory: negative for cough or wheezing Urologic: negative for hematuria Abdominal: negative for nausea, vomiting, diarrhea, bright red blood per rectum, melena, or hematemesis Neurologic: negative for visual changes, syncope, or dizziness All other systems reviewed and are  otherwise negative except as noted above.    Blood pressure (!) 160/90, pulse (!) 58, height 5\' 10"  (1.778 m), weight 184 lb 6.4 oz (83.6 kg), SpO2 97 %.  Pap smear  EKG sinus bradycardia at 58 without ST or T wave changes.  I personally reviewed this EKG.  ASSESSMENT AND PLAN:   Essential hypertension History of essential hypertension with blood pressure measured today in the office of 160/90.  He did show me a blood pressure log that showed blood pressure in the 130/80 range fairly consistently.  He is on hydrochlorothiazide and metoprolol.  Dyslipidemia, goal LDL below 70 History of dyslipidemia on statin therapy with lipid profile performed 12/06/2020 revealing total cholesterol 119, LDL 64 and HDL 40.  Hx of CABG History of CAD status post coronary artery bypass grafting x4 by Dr. 12/08/2020 January 2001.  He underwent cardiac catheterization by Dr. February 2001 after an abnormal stress test that showed patent grafts with a nongrafted RCA and high-grade RCA lesion which ultimately was stented by Dr. Charolette Child with a 2.5 mm x 30 mm long drug-eluting stent 12/05/2004.  He had a stress test performed 08/25/2018 that was nonischemic.  He denies chest pain.     10/25/2018 MD FACP,FACC,FAHA, [facility] 02/21/2021 8:49 AM

## 2021-02-21 NOTE — Assessment & Plan Note (Signed)
History of dyslipidemia on statin therapy with lipid profile performed 12/06/2020 revealing total cholesterol 119, LDL 64 and HDL 40.

## 2021-05-12 DIAGNOSIS — R0989 Other specified symptoms and signs involving the circulatory and respiratory systems: Secondary | ICD-10-CM | POA: Diagnosis not present

## 2021-05-12 DIAGNOSIS — I10 Essential (primary) hypertension: Secondary | ICD-10-CM | POA: Diagnosis not present

## 2021-05-12 DIAGNOSIS — R1084 Generalized abdominal pain: Secondary | ICD-10-CM | POA: Diagnosis not present

## 2021-06-09 DIAGNOSIS — I10 Essential (primary) hypertension: Secondary | ICD-10-CM | POA: Diagnosis not present

## 2021-06-09 DIAGNOSIS — K219 Gastro-esophageal reflux disease without esophagitis: Secondary | ICD-10-CM | POA: Diagnosis not present

## 2021-06-09 DIAGNOSIS — D751 Secondary polycythemia: Secondary | ICD-10-CM | POA: Diagnosis not present

## 2021-06-13 DIAGNOSIS — D751 Secondary polycythemia: Secondary | ICD-10-CM | POA: Diagnosis not present

## 2021-12-12 DIAGNOSIS — E785 Hyperlipidemia, unspecified: Secondary | ICD-10-CM | POA: Diagnosis not present

## 2021-12-12 DIAGNOSIS — I10 Essential (primary) hypertension: Secondary | ICD-10-CM | POA: Diagnosis not present

## 2021-12-12 DIAGNOSIS — K219 Gastro-esophageal reflux disease without esophagitis: Secondary | ICD-10-CM | POA: Diagnosis not present

## 2021-12-12 DIAGNOSIS — Z125 Encounter for screening for malignant neoplasm of prostate: Secondary | ICD-10-CM | POA: Diagnosis not present

## 2021-12-13 LAB — LAB REPORT - SCANNED: EGFR: 61

## 2021-12-19 DIAGNOSIS — Z86010 Personal history of colonic polyps: Secondary | ICD-10-CM | POA: Diagnosis not present

## 2021-12-19 DIAGNOSIS — R0789 Other chest pain: Secondary | ICD-10-CM | POA: Diagnosis not present

## 2021-12-19 DIAGNOSIS — I2510 Atherosclerotic heart disease of native coronary artery without angina pectoris: Secondary | ICD-10-CM | POA: Diagnosis not present

## 2021-12-19 DIAGNOSIS — I10 Essential (primary) hypertension: Secondary | ICD-10-CM | POA: Diagnosis not present

## 2021-12-19 DIAGNOSIS — Z23 Encounter for immunization: Secondary | ICD-10-CM | POA: Diagnosis not present

## 2021-12-19 DIAGNOSIS — H6122 Impacted cerumen, left ear: Secondary | ICD-10-CM | POA: Diagnosis not present

## 2021-12-19 DIAGNOSIS — J302 Other seasonal allergic rhinitis: Secondary | ICD-10-CM | POA: Diagnosis not present

## 2021-12-19 DIAGNOSIS — N529 Male erectile dysfunction, unspecified: Secondary | ICD-10-CM | POA: Diagnosis not present

## 2021-12-19 DIAGNOSIS — K219 Gastro-esophageal reflux disease without esophagitis: Secondary | ICD-10-CM | POA: Diagnosis not present

## 2021-12-19 DIAGNOSIS — Z0000 Encounter for general adult medical examination without abnormal findings: Secondary | ICD-10-CM | POA: Diagnosis not present

## 2021-12-19 DIAGNOSIS — I6523 Occlusion and stenosis of bilateral carotid arteries: Secondary | ICD-10-CM | POA: Diagnosis not present

## 2022-02-04 DIAGNOSIS — Z86010 Personal history of colonic polyps: Secondary | ICD-10-CM | POA: Diagnosis not present

## 2022-03-03 DIAGNOSIS — D120 Benign neoplasm of cecum: Secondary | ICD-10-CM | POA: Diagnosis not present

## 2022-03-03 DIAGNOSIS — Z86010 Personal history of colonic polyps: Secondary | ICD-10-CM | POA: Diagnosis not present

## 2022-03-03 DIAGNOSIS — Z09 Encounter for follow-up examination after completed treatment for conditions other than malignant neoplasm: Secondary | ICD-10-CM | POA: Diagnosis not present

## 2022-03-03 DIAGNOSIS — K633 Ulcer of intestine: Secondary | ICD-10-CM | POA: Diagnosis not present

## 2022-03-05 DIAGNOSIS — K633 Ulcer of intestine: Secondary | ICD-10-CM | POA: Diagnosis not present

## 2022-03-05 DIAGNOSIS — D120 Benign neoplasm of cecum: Secondary | ICD-10-CM | POA: Diagnosis not present

## 2022-03-06 ENCOUNTER — Encounter: Payer: Self-pay | Admitting: Cardiovascular Disease

## 2022-03-06 ENCOUNTER — Ambulatory Visit: Payer: PPO | Attending: Cardiovascular Disease | Admitting: Cardiovascular Disease

## 2022-03-06 VITALS — BP 130/90 | HR 64 | Ht 70.0 in | Wt 178.0 lb

## 2022-03-06 DIAGNOSIS — Z951 Presence of aortocoronary bypass graft: Secondary | ICD-10-CM

## 2022-03-06 DIAGNOSIS — E785 Hyperlipidemia, unspecified: Secondary | ICD-10-CM | POA: Diagnosis not present

## 2022-03-06 DIAGNOSIS — I10 Essential (primary) hypertension: Secondary | ICD-10-CM | POA: Diagnosis not present

## 2022-03-06 NOTE — Progress Notes (Signed)
03/06/2022 ERASTO SLEIGHT   10-27-1950  [id number]  Primary Physician Merri Brunette, MD Primary Cardiologist: Runell Gess MD Nicholes Calamity, [place]  HPI:  Kenneth Sanchez is a 71 y.o.    mildly overweight married Caucasian male, father of 3, grandfather  4 grandchildren,, who works at  at [employer] part-time 4 days a week.. I last saw him virtually on 02/23/2020. He has a history of CAD, status post coronary artery bypass grafting x4 in January 2001. He apparently had an abnormal Bruce-protocol exercise stress test with nonsustained ventricular tachycardia and was catheterized by Dr. Charolette Child revealing patent grafts with an ungrafted RCA and a high-grade RCA lesion, which was stented by Dr. Lenise Herald with a drug-eluting stent (2.5 x 13 mm) December 05, 2004. His other problems include hypertension and hyperlipidemia. He does complain of some abdominal discomfort radiating to his back but denies chest pain or shortness of breath. His most recent lab work, performed in May,wwas apparently favorable but we are in the process of obtaining those results. I obtain a Myoview stress test on him August 2014 which was normal     Dr. Renne Crigler follows his lipid profile performed 12/06/2020 revealing total cholesterol 119, LDL 64 and HDL 40.Since I saw him a year ago he is done well.  He did have a Myoview performed 08/25/2018 which was nonischemic.  He denies chest pain or shortness of breath.  He is fairly active and walks at least 3 miles a day.   Current Meds  Medication Sig   Ascorbic Acid (VITAMIN C) 1000 MG tablet Take 1,000 mg by mouth daily.   aspirin 81 MG tablet Take 81 mg by mouth every evening.    atorvastatin (LIPITOR) 40 MG tablet Take 40 mg by mouth at bedtime.    ezetimibe (ZETIA) 10 MG tablet Take 10 mg by mouth daily.   famotidine (PEPCID) 20 MG tablet Take 20 mg by mouth 2 (two) times daily.   folic acid (FOLVITE) 400 MCG tablet Take 400 mcg by mouth every evening.     hydrochlorothiazide (HYDRODIURIL) 12.5 MG tablet Take 12.5 mg by mouth daily.   metoprolol tartrate (LOPRESSOR) 50 MG tablet Take 50 mg by mouth 2 (two) times daily. Take 1.5 Tablets Twice Daily as Directed   Multiple Vitamin (MULTIVITAMIN) capsule Take 1 capsule by mouth daily.   NITROSTAT 0.4 MG SL tablet Take 0.4 mg by mouth every 5 (five) minutes as needed for chest pain (take up to 3 times daily).    Omega-3 Fatty Acids (FISH OIL) 600 MG CAPS Take 2,400 mg by mouth daily.    OVER THE COUNTER MEDICATION every other day. Beet Root   pantoprazole (PROTONIX) 40 MG tablet    sildenafil (VIAGRA) 50 MG tablet Take 50 mg by mouth daily as needed for erectile dysfunction.     No Known Allergies  Social History   Socioeconomic History   Marital status: Married    Spouse name: Not on file   Number of children: 3   Years of education: Not on file   Highest education level: Not on file  Occupational History   Occupation: Production designer, theatre/television/film    Employer: [employer]  Tobacco Use   Smoking status: Never   Smokeless tobacco: Never  Substance and Sexual Activity   Alcohol use: No   Drug use: No   Sexual activity: Never  Other Topics Concern   Not on file  Social History Narrative  Not on file   Social Determinants of Health   Financial Resource Strain: Not on file  Food Insecurity: Not on file  Transportation Needs: Not on file  Physical Activity: Not on file  Stress: Not on file  Social Connections: Not on file  Intimate Partner Violence: Not on file     Review of Systems: General: negative for chills, fever, night sweats or weight changes.  Cardiovascular: negative for chest pain, dyspnea on exertion, edema, orthopnea, palpitations, paroxysmal nocturnal dyspnea or shortness of breath Dermatological: negative for rash Respiratory: negative for cough or wheezing Urologic: negative for hematuria Abdominal: negative for nausea, vomiting, diarrhea, bright red blood per  rectum, melena, or hematemesis Neurologic: negative for visual changes, syncope, or dizziness All other systems reviewed and are otherwise negative except as noted above.    Blood pressure (!) 130/90, pulse 64, height 5\' 10"  (1.778 m), weight 178 lb (80.7 kg), SpO2 99 %.  General appearance: alert and no distress Neck: no adenopathy, no carotid bruit, no JVD, supple, symmetrical, trachea midline, and thyroid not enlarged, symmetric, no tenderness/mass/nodules Lungs: clear to auscultation bilaterally Heart: regular rate and rhythm, S1, S2 normal, no murmur, click, rub or gallop Extremities: extremities normal, atraumatic, no cyanosis or edema Pulses: 2+ and symmetric Skin: Skin color, texture, turgor normal. No rashes or lesions Neurologic: Grossly normal  EKG sinus rhythm at 64 with early R wave transition and nonspecific ST and T wave changes.  I personally reviewed this EKG.  ASSESSMENT AND PLAN:   Essential hypertension History of essential hypertension a blood pressure measured today at 130/90.  He is on hydrochlorothiazide and metoprolol.  I reviewed his blood pressure readings at home and for the most part they are within normal range.  Dyslipidemia, goal LDL below 70 History of dyslipidemia on Zetia and atorvastatin followed by his PCP  Hx of CABG History of CAD status post coronary bypass grafting times 24 April 1999.  He had stenting of a high-grade ungrafted RCA by Dr. 26 April 1999 12/05/2004 with a 2.5 mm x 13 mm long drug-eluting stent.  His last Myoview stress test performed 08/25/2018 was nonischemic.  Fairly active and denies symptoms.     10/25/2018 MD FACP,FACC,FAHA, [facility] 03/06/2022 8:57 AM

## 2022-03-06 NOTE — Assessment & Plan Note (Signed)
History of dyslipidemia on Zetia and atorvastatin followed by his PCP

## 2022-03-06 NOTE — Assessment & Plan Note (Addendum)
History of essential hypertension a blood pressure measured today at 130/90.  He is on hydrochlorothiazide and metoprolol.  I reviewed his blood pressure readings at home and for the most part they are within normal range.

## 2022-03-06 NOTE — Patient Instructions (Signed)
Medication Instructions:  Your physician recommends that you continue on your current medications as directed. Please refer to the Current Medication list given to you today.  *If you need a refill on your cardiac medications before your next appointment, please call your pharmacy*   Follow-Up: At [facility], you and your health needs are our priority.  As part of our continuing mission to provide you with exceptional heart care, we have created designated Provider Care Teams.  These Care Teams include your primary Cardiologist (physician) and Advanced Practice Providers (APPs -  Physician Assistants and Nurse Practitioners) who all work together to provide you with the care you need, when you need it.  We recommend signing up for the patient portal called "MyChart".  Sign up information is provided on this After Visit Summary.  MyChart is used to connect with patients for Virtual Visits (Telemedicine).  Patients are able to view lab/test results, encounter notes, upcoming appointments, etc.  Non-urgent messages can be sent to your provider as well.   To learn more about what you can do with MyChart, go to [email or web address].    Your next appointment:   12 month(s)  The format for your next appointment:   In Person  Provider:   Jonathan Berry, MD   

## 2022-03-06 NOTE — Assessment & Plan Note (Signed)
History of CAD status post coronary bypass grafting times 24 April 1999.  He had stenting of a high-grade ungrafted RCA by Dr. Jenne Campus 12/05/2004 with a 2.5 mm x 13 mm long drug-eluting stent.  His last Myoview stress test performed 08/25/2018 was nonischemic.  Fairly active and denies symptoms.

## 2022-04-27 DIAGNOSIS — Z86010 Personal history of colonic polyps: Secondary | ICD-10-CM | POA: Diagnosis not present

## 2022-04-27 DIAGNOSIS — K219 Gastro-esophageal reflux disease without esophagitis: Secondary | ICD-10-CM | POA: Diagnosis not present

## 2022-04-27 DIAGNOSIS — R1310 Dysphagia, unspecified: Secondary | ICD-10-CM | POA: Diagnosis not present

## 2022-04-28 DIAGNOSIS — K219 Gastro-esophageal reflux disease without esophagitis: Secondary | ICD-10-CM | POA: Diagnosis not present

## 2022-04-28 DIAGNOSIS — K317 Polyp of stomach and duodenum: Secondary | ICD-10-CM | POA: Diagnosis not present

## 2022-04-28 DIAGNOSIS — K2930 Chronic superficial gastritis without bleeding: Secondary | ICD-10-CM | POA: Diagnosis not present

## 2022-04-28 DIAGNOSIS — R1310 Dysphagia, unspecified: Secondary | ICD-10-CM | POA: Diagnosis not present

## 2022-04-30 DIAGNOSIS — K2930 Chronic superficial gastritis without bleeding: Secondary | ICD-10-CM | POA: Diagnosis not present

## 2022-04-30 DIAGNOSIS — K219 Gastro-esophageal reflux disease without esophagitis: Secondary | ICD-10-CM | POA: Diagnosis not present

## 2022-04-30 DIAGNOSIS — K317 Polyp of stomach and duodenum: Secondary | ICD-10-CM | POA: Diagnosis not present

## 2022-11-09 DIAGNOSIS — K219 Gastro-esophageal reflux disease without esophagitis: Secondary | ICD-10-CM | POA: Diagnosis not present

## 2022-11-09 DIAGNOSIS — R109 Unspecified abdominal pain: Secondary | ICD-10-CM | POA: Diagnosis not present

## 2023-01-08 DIAGNOSIS — I10 Essential (primary) hypertension: Secondary | ICD-10-CM | POA: Diagnosis not present

## 2023-01-08 DIAGNOSIS — Z125 Encounter for screening for malignant neoplasm of prostate: Secondary | ICD-10-CM | POA: Diagnosis not present

## 2023-01-09 LAB — LAB REPORT - SCANNED: EGFR: 74

## 2023-01-18 DIAGNOSIS — M7741 Metatarsalgia, right foot: Secondary | ICD-10-CM | POA: Diagnosis not present

## 2023-01-18 DIAGNOSIS — N529 Male erectile dysfunction, unspecified: Secondary | ICD-10-CM | POA: Diagnosis not present

## 2023-01-18 DIAGNOSIS — I6523 Occlusion and stenosis of bilateral carotid arteries: Secondary | ICD-10-CM | POA: Diagnosis not present

## 2023-01-18 DIAGNOSIS — K219 Gastro-esophageal reflux disease without esophagitis: Secondary | ICD-10-CM | POA: Diagnosis not present

## 2023-01-18 DIAGNOSIS — J302 Other seasonal allergic rhinitis: Secondary | ICD-10-CM | POA: Diagnosis not present

## 2023-01-18 DIAGNOSIS — Z0000 Encounter for general adult medical examination without abnormal findings: Secondary | ICD-10-CM | POA: Diagnosis not present

## 2023-01-18 DIAGNOSIS — Z23 Encounter for immunization: Secondary | ICD-10-CM | POA: Diagnosis not present

## 2023-01-18 DIAGNOSIS — I2510 Atherosclerotic heart disease of native coronary artery without angina pectoris: Secondary | ICD-10-CM | POA: Diagnosis not present

## 2023-01-18 DIAGNOSIS — I10 Essential (primary) hypertension: Secondary | ICD-10-CM | POA: Diagnosis not present

## 2023-02-09 ENCOUNTER — Emergency Department (HOSPITAL_COMMUNITY): Payer: PPO

## 2023-02-09 ENCOUNTER — Emergency Department (HOSPITAL_COMMUNITY)
Admission: EM | Admit: 2023-02-09 | Discharge: 2023-02-09 | Disposition: A | Discharge status: Home / Self Care | Payer: PPO | Attending: Emergency Medicine | Admitting: Emergency Medicine

## 2023-02-09 ENCOUNTER — Encounter (HOSPITAL_COMMUNITY): Payer: Self-pay

## 2023-02-09 DIAGNOSIS — R079 Chest pain, unspecified: Secondary | ICD-10-CM | POA: Diagnosis not present

## 2023-02-09 DIAGNOSIS — R0789 Other chest pain: Secondary | ICD-10-CM | POA: Insufficient documentation

## 2023-02-09 DIAGNOSIS — Z951 Presence of aortocoronary bypass graft: Secondary | ICD-10-CM | POA: Insufficient documentation

## 2023-02-09 DIAGNOSIS — Z7982 Long term (current) use of aspirin: Secondary | ICD-10-CM | POA: Insufficient documentation

## 2023-02-09 HISTORY — DX: Acute myocardial infarction, unspecified: I21.9

## 2023-02-09 HISTORY — DX: Presence of aortocoronary bypass graft: Z95.1

## 2023-02-09 LAB — CBC
HCT: 46.9 % (ref 39.0–52.0)
Hemoglobin: 16.1 g/dL (ref 13.0–17.0)
MCH: 31.1 pg (ref 26.0–34.0)
MCHC: 34.3 g/dL (ref 30.0–36.0)
MCV: 90.5 fL (ref 80.0–100.0)
Platelets: 248 10*3/uL (ref 150–400)
RBC: 5.18 MIL/uL (ref 4.22–5.81)
RDW: 12.2 % (ref 11.5–15.5)
WBC: 7.3 10*3/uL (ref 4.0–10.5)
nRBC: 0 % (ref 0.0–0.2)

## 2023-02-09 LAB — BASIC METABOLIC PANEL
Anion gap: 13 (ref 5–15)
BUN: 22 mg/dL (ref 8–23)
CO2: 22 mmol/L (ref 22–32)
Calcium: 9.4 mg/dL (ref 8.9–10.3)
Chloride: 102 mmol/L (ref 98–111)
Creatinine, Ser: 1.01 mg/dL (ref 0.61–1.24)
GFR, Estimated: >60 mL/min (ref >60)
Glucose, Bld: 100 mg/dL — ABNORMAL HIGH (ref 70–99)
Potassium: 3.4 mmol/L — ABNORMAL LOW (ref 3.5–5.1)
Sodium: 137 mmol/L (ref 135–145)

## 2023-02-09 LAB — TROPONIN I (HIGH SENSITIVITY)
Troponin I (High Sensitivity): 5 ng/L (ref <18)
Troponin I (High Sensitivity): 4 ng/L (ref <18)

## 2023-02-09 NOTE — Discharge Instructions (Signed)
As discussed, your evaluation today has been largely reassuring.  But, it is important that you monitor your condition carefully, and do not hesitate to return to the ED if you develop new, or concerning changes in your condition. ? ?Otherwise, please follow-up with your physician for appropriate ongoing care. ? ?

## 2023-02-09 NOTE — ED Triage Notes (Addendum)
Pt arrived POV for CP intermittently since Friday while climbing a ladder and got nauseated. Since Friday has had left sided intermittent CP that is sharp, non-radiating. MI and stent placed in 2006 and Quadruple bypass in 2001. Pt takes ASA 81 mg daily, stopped Plavix 4-5 years ago. Denies SOB, no n/v at current. NAD noted, VSS, A&O x4. Pt reports no CP at current. EKG NSR

## 2023-02-09 NOTE — ED Provider Notes (Signed)
[place] EMERGENCY DEPARTMENT AT [facility] Provider Note   CSN: [id number] Arrival date & time: 02/09/23  0557     History  Chief Complaint  Patient presents with   Chest Pain    Kenneth Sanchez is a 72 y.o. male.  HPI Patient presents with his wife who assists with the history. Patient has a history notable for CABG, stent, but has been doing generally well.  4 days ago the patient was on a ladder, felt unsettled, nauseous, lightheaded.  Since that time he has had episodes of brief sharp focal chest discomfort.  When he has these episodes the pain is reproducible with mild palpation.  Yesterday was able to do a day of yard work without any exertional pain.  Today he presents after feeling unsettled again this morning.  No focal weakness, no fever, no vomiting though there was nausea as above.     Home Medications Prior to Admission medications   Medication Sig Start Date End Date Taking? Authorizing Provider  Ascorbic Acid (VITAMIN C) 1000 MG tablet Take 1,000 mg by mouth daily.    [provider]  aspirin 81 MG tablet Take 81 mg by mouth every evening.     [provider]  atorvastatin (LIPITOR) 40 MG tablet Take 40 mg by mouth at bedtime.     [provider]  ezetimibe (ZETIA) 10 MG tablet Take 10 mg by mouth daily. 12/30/18   [provider]  famotidine (PEPCID) 20 MG tablet Take 20 mg by mouth 2 (two) times daily.    [provider]  folic acid (FOLVITE) 400 MCG tablet Take 400 mcg by mouth every evening.     [provider]  hydrochlorothiazide (HYDRODIURIL) 12.5 MG tablet Take 12.5 mg by mouth daily. 07/13/18   [provider]  metoprolol tartrate (LOPRESSOR) 50 MG tablet Take 50 mg by mouth 2 (two) times daily. Take 1.5 Tablets Twice Daily as Directed    [provider]  Multiple Vitamin (MULTIVITAMIN) capsule Take 1 capsule by mouth daily.    [provider]  NITROSTAT 0.4 MG SL  tablet Take 0.4 mg by mouth every 5 (five) minutes as needed for chest pain (take up to 3 times daily).  09/13/12   [provider]  Omega-3 Fatty Acids (FISH OIL) 600 MG CAPS Take 2,400 mg by mouth daily.     [provider]  OVER THE COUNTER MEDICATION every other day. Beet Root    [provider]  pantoprazole (PROTONIX) 40 MG tablet     [provider]  sildenafil (VIAGRA) 50 MG tablet Take 50 mg by mouth daily as needed for erectile dysfunction.    [provider]      Allergies    Patient has no known allergies.    Review of Systems   Review of Systems  Physical Exam Updated Vital Signs BP 106/80   Pulse (!) 59   Temp 98 F (36.7 C) (Oral)   Resp 13   Ht 5\' 10"  (1.778 m)   Wt 79.8 kg   SpO2 95%   BMI 25.25 kg/m  Physical Exam Vitals and nursing note reviewed.  Constitutional:      General: He is not in acute distress.    Appearance: He is well-developed.  HENT:     Head: Normocephalic and atraumatic.  Eyes:     Conjunctiva/sclera: Conjunctivae normal.  Cardiovascular:     Rate and Rhythm: Normal rate and regular rhythm.  Pulmonary:     Effort: Pulmonary effort is normal. No respiratory distress.     Breath sounds: No stridor.  Chest:     Comments: No reproducible tenderness, but the patient specifically points to an area just above left superior third rib as uncomfortable with palpation occasionally. Abdominal:     General: There is no distension.  Skin:    General: Skin is warm and dry.  Neurological:     Mental Status: He is alert and oriented to person, place, and time.     ED Results / Procedures / Treatments   Labs (all labs ordered are listed, but only abnormal results are displayed) Labs Reviewed  BASIC METABOLIC PANEL - Abnormal; Notable for the following components:      Result Value   Potassium 3.4 (*)    Glucose, Bld 100 (*)    All other components within normal limits  CBC  TROPONIN I (HIGH  SENSITIVITY)  TROPONIN I (HIGH SENSITIVITY)    EKG EKG Interpretation Date/Time:  Tuesday February 09 2023 06:06:26 EDT Ventricular Rate:  67 PR Interval:  152 QRS Duration:  100 QT Interval:  420 QTC Calculation: 443 R Axis:   67  Text Interpretation: Normal sinus rhythm Normal ECG Confirmed by Gerhard Munch [phone]) on 02/09/2023 8:33:16 AM  Radiology DG Chest 2 View  Result Date: 02/09/2023 CLINICAL DATA:  chest pain EXAM: CHEST - 2 VIEW COMPARISON:  Chest radiograph dated 03/26/2016. FINDINGS: The heart size and mediastinal contours are within normal limits. Both lungs are clear. No pneumothorax or pleural effusion. Prior median sternotomy. No acute osseous abnormality. IMPRESSION: No acute cardiopulmonary findings. Electronically Signed   By: Hart Robinsons M.D.   On: 02/09/2023 09:45    Procedures Procedures    Medications Ordered in ED Medications - No data to display  ED Course/ Medical Decision Making/ A&P                                 Medical Decision Making Adult male with risk profile for cardiac disease substantial given history of CABG, stent, now presents with episodic focal chest pain, reproducible after an initial episode of unsettled sensation without actual chest pain.  Given his ability to perform exertion yesterday, without any chest pain, low suspicion for ACS though this is primary consideration. Other considerations include musculoskeletal, or pleuritic discomfort. No evidence for dissection with no neurocomplaints, no substantial hypertension, no persistent pain or posterior pain.  Cardiac 70 sinus normal Pulse ox 100% room air normal   Amount and/or Complexity of Data Reviewed Independent Historian: spouse External Data Reviewed: notes. Labs: ordered. Decision-making details documented in ED Course. Radiology: ordered and independent interpretation performed. Decision-making details documented in ED Course. ECG/medicine tests: ordered and  independent interpretation performed. Decision-making details documented in ED Course.   On repeat exam patient is in no distress, all findings reviewed including troponin x 2 normal, nonischemic EKG, unremarkable vital signs.  With reassuring chest x-ray as well, no evidence for pneumonia, ACS, patient will follow-up with his outpatient team.        Final Clinical Impression(s) / ED Diagnoses Final diagnoses:  Atypical chest pain    Rx / DC Orders ED Discharge Orders     None         Gerhard Munch, MD 02/09/23 1145

## 2023-03-10 NOTE — Progress Notes (Unsigned)
Cardiology Clinic Note   Patient Name: Kenneth Sanchez Date of Encounter: 03/16/2023  Primary Care Provider:  Merri Brunette, MD Primary Cardiologist:  Nanetta Batty, MD  Patient Profile    Kenneth Sanchez 71 year old male presents to the clinic today for follow-up evaluation of his coronary artery disease status post CABG and hyperlipidemia.  Past Medical History    Past Medical History:  Diagnosis Date   Abnormal stress test 2006   NSVT; had cardiac cath-12/05/04   CAD (coronary artery disease)    CABG x4- 05/06/99; cath with PCI 12/05/2004   History of quadruple bypass    2001   Hyperlipemia    Hypertension    MI (myocardial infarction) (HCC)    2001   Past Surgical History:  Procedure Laterality Date   CORONARY ANGIOPLASTY WITH STENT PLACEMENT  12/05/2004   cypher stent 2.5x51mm to mid RCA, inflation to 14 atmospheres   CORONARY ARTERY BYPASS GRAFT  05/06/99   x4; LIMA to distal LAD; RIMA to distal RCA, SVG to first diag branch, SVG to circ marginal branch    Allergies  No Known Allergies  History of Present Illness    Kenneth Sanchez has a PMH of essential hypertension, hyperlipidemia,Coronary artery disease status post CABG x 4 1/01.  He had an abnormal exercise stress test with NSVT.  He was catheterized by Dr. Aleen Campi.  He was noted to have patent grafts with and on grafted RCA and high-grade RCA lesion.  He was stented by Dr. Jenne Campus with DES 8/06.  He reported chest discomfort and a nuclear stress test was ordered 8/14.  It was normal.  He had subsequent nuclear stress test 08/25/2018 which was nonischemic.  He was seen in follow-up by Dr. Allyson Sabal 03/06/2022.  During that time he denied shortness of breath and chest discomfort.  He had been fairly active.  He reported walking 3 miles per day.  His EKG showed sinus rhythm 64 bpm with nonspecific ST and T wave changes.  In the clinic his blood pressure was elevated at 130/90.  His home blood pressure log was  reviewed and showed blood pressures in the normal range.  He presented to the emergency department 02/09/2023 with chest discomfort.  He reported that he had been doing well.  4 days previously he had been on a ladder and felt unsettled nauseous and lightheaded.  He reported brief sharp episodes of chest discomfort.  His chest discomfort was reproducible with mild palpation.  He reported the day prior to coming to the emergency department he was able to do yard work without exertional pain.  His cardiac troponins were normal x 2.  His EKG was nonischemic.  His vital signs were unremarkable.  His chest x-ray showed no acute cardiopulmonary abnormality.  Outpatient cardiology follow-up was recommended.  He presents to the clinic today for follow-up evaluation and states he continues to be very physically active and has not had any further episodes of chest discomfort.  He denies exertional chest pain.  He brings in a blood pressure log today which shows well-controlled blood pressures in the 120s over 70s and 80s.  We reviewed his recent emergency department visit and his diagnostic testing.  He expressed understanding.  I will continue his current medication, continue his current physical activity, and plan follow-up in 12 months..  Today he denies chest pain, shortness of breath, lower extremity edema, fatigue, palpitations, melena, hematuria, hemoptysis, diaphoresis, weakness, presyncope, syncope, orthopnea, and PND.  Home Medications    Prior to Admission medications   Medication Sig Start Date End Date Taking? Authorizing Provider  Ascorbic Acid (VITAMIN C) 1000 MG tablet Take 1,000 mg by mouth daily.    [provider]  aspirin 81 MG tablet Take 81 mg by mouth every evening.     [provider]  atorvastatin (LIPITOR) 40 MG tablet Take 40 mg by mouth at bedtime.     [provider]  ezetimibe (ZETIA) 10 MG tablet Take 10 mg by mouth daily. 12/30/18   [provider]  famotidine (PEPCID) 20 MG tablet Take 20 mg by mouth 2 (two) times daily.    [provider]  folic acid (FOLVITE) 400 MCG tablet Take 400 mcg by mouth every evening.     [provider]  hydrochlorothiazide (HYDRODIURIL) 12.5 MG tablet Take 12.5 mg by mouth daily. 07/13/18   [provider]  metoprolol tartrate (LOPRESSOR) 50 MG tablet Take 50 mg by mouth 2 (two) times daily. Take 1.5 Tablets Twice Daily as Directed    [provider]  Multiple Vitamin (MULTIVITAMIN) capsule Take 1 capsule by mouth daily.    [provider]  NITROSTAT 0.4 MG SL tablet Take 0.4 mg by mouth every 5 (five) minutes as needed for chest pain (take up to 3 times daily).  09/13/12   [provider]  Omega-3 Fatty Acids (FISH OIL) 600 MG CAPS Take 2,400 mg by mouth daily.     [provider]  OVER THE COUNTER MEDICATION every other day. Beet Root    [provider]  pantoprazole (PROTONIX) 40 MG tablet     [provider]  sildenafil (VIAGRA) 50 MG tablet Take 50 mg by mouth daily as needed for erectile dysfunction.    [provider]    Family History    Family History  Problem Relation Age of Onset   Heart disease Mother 54   COPD Father    He indicated that his mother is deceased. He indicated that his father is deceased.  Social History    Social History   Socioeconomic History   Marital status: Married    Spouse name: Not on file   Number of children: 3   Years of education: Not on file   Highest education level: Not on file  Occupational History   Occupation: Production designer, theatre/television/film    Employer: [employer]  Tobacco Use   Smoking status: Never   Smokeless tobacco: Never  Vaping Use   Vaping status: Never Used  Substance and Sexual Activity   Alcohol use: No   Drug use: No   Sexual activity: Not Currently  Other Topics Concern   Not on file  Social History Narrative   Not on file   Social  Determinants of Health   Financial Resource Strain: Not on file  Food Insecurity: Not on file  Transportation Needs: Not on file  Physical Activity: Not on file  Stress: Not on file  Social Connections: Not on file  Intimate Partner Violence: Not on file     Review of Systems    General:  No chills, fever, night sweats or weight changes.  Cardiovascular:  No chest pain, dyspnea on exertion, edema, orthopnea, palpitations, paroxysmal nocturnal dyspnea. Dermatological: No rash, lesions/masses Respiratory: No cough, dyspnea Urologic: No hematuria, dysuria Abdominal:   No nausea, vomiting, diarrhea, bright red blood per rectum, melena, or hematemesis Neurologic:  No visual changes, wkns, changes in mental status.  All other systems reviewed and are otherwise negative except as noted above.  Physical Exam    VS:  BP 134/88   Pulse 65   Ht 5\' 9"  (1.753 m)   Wt 182 lb 6.4 oz (82.7 kg)   SpO2 98%   BMI 26.94 kg/m  , BMI Body mass index is 26.94 kg/m. GEN: Well nourished, well developed, in no acute distress. HEENT: normal. Neck: Supple, no JVD, carotid bruits, or masses. Cardiac: RRR, no murmurs, rubs, or gallops. No clubbing, cyanosis, edema.  Radials/DP/PT 2+ and equal bilaterally.  Respiratory:  Respirations regular and unlabored, clear to auscultation bilaterally. GI: Soft, nontender, nondistended, BS + x 4. MS: no deformity or atrophy. Skin: warm and dry, no rash. Neuro:  Strength and sensation are intact. Psych: Normal affect.  Accessory Clinical Findings    Recent Labs: 02/09/2023: BUN 22; Creatinine, Ser 1.01; Hemoglobin 16.1; Platelets 248; Potassium 3.4; Sodium 137   Recent Lipid Panel No results found for: "CHOL", "TRIG", "HDL", "CHOLHDL", "VLDL", "LDLCALC", "LDLDIRECT"       ECG personally reviewed by me today-none today.    Echocardiogram 03/26/2016  Study Conclusions   - Left ventricle: The cavity size was normal. Systolic function was    normal. The  estimated ejection fraction was in the range of 55%    to 60%. Wall motion was normal; there were no regional wall    motion abnormalities. Left ventricular diastolic function    parameters were normal.  - Aortic valve: Trileaflet; mildly thickened, mildly calcified    leaflets.   -------------------------------------------------------------------  Study data:   Study status:  Routine.  Study completion:  There  were no complications.          Transthoracic echocardiography.  M-mode, complete 2D, spectral Doppler, and color Doppler.  Birthdate:  Patient birthdate: 18-Aug-1950.  Age:  Patient is 72 yr  old.  Sex:  Gender: male.    BMI: 27.4 kg/m^2.  Blood pressure:  128/79  Patient status:  Inpatient.  Study date:  Study date:  03/26/2016. Study time: 02:16 PM.  Location:  Bedside.   -------------------------------------------------------------------   -------------------------------------------------------------------  Left ventricle:  The cavity size was normal. Systolic function was  normal. The estimated ejection fraction was in the range of 55% to  60%. Wall motion was normal; there were no regional wall motion  abnormalities. The transmitral flow pattern was normal. The  deceleration time of the early transmitral flow velocity was  normal. The pulmonary vein flow pattern was normal. The tissue  Doppler parameters were normal. Left ventricular diastolic function  parameters were normal.   -------------------------------------------------------------------  Aortic valve:   Trileaflet; mildly thickened, mildly calcified  leaflets. Mobility was not restricted.  Doppler:  Transvalvular  velocity was within the normal range. There was no stenosis. There  was no regurgitation.   -------------------------------------------------------------------  Aorta: Aortic root: The aortic root was normal in size.   -------------------------------------------------------------------  Mitral  valve:   Structurally normal valve.   Mobility was not  restricted.  Doppler:  Transvalvular velocity was within the normal  range. There was no evidence for stenosis. There was trivial  regurgitation.    Peak gradient (D): 3 mm Hg.   -------------------------------------------------------------------  Left atrium:  The atrium was normal in size.   -------------------------------------------------------------------  Right ventricle:  The cavity size was normal. Wall thickness was  normal. Systolic function was normal.   -------------------------------------------------------------------  Pulmonic valve:    Structurally normal valve.   Cusp separation was  normal.  Doppler:  Transvalvular velocity was within the normal  range. There was no evidence for stenosis. There was trivial  regurgitation.   -------------------------------------------------------------------  Tricuspid valve:   Structurally normal valve.    Doppler:  Transvalvular velocity was within the normal range. There was no  regurgitation.   -------------------------------------------------------------------  Pulmonary artery:   The main pulmonary artery was normal-sized.  Systolic pressure was within the normal range.   -------------------------------------------------------------------  Right atrium:  The atrium was normal in size.   -------------------------------------------------------------------  Pericardium: There was no pericardial effusion.   -------------------------------------------------------------------  Systemic veins:  Inferior vena cava: The vessel was normal in size.    Nuclear stress test 08/25/2018  Nuclear stress EF: 60%. The left ventricular ejection fraction is normal (55-65%). This is a low risk study. No evidence of ischemia. No evidence of infarction The study is normal.      Assessment & Plan   1.  Coronary artery disease-no chest pain today.  He is status post CABG x 4 on 1/01.   He underwent repeat cardiac catheterization with PCI and DES 8/06.  Had subsequent nuclear stress test 08/2018 which showed no ischemia. Continue aspirin, atorvastatin, metoprolol, omega-3 fatty acids, nitroglycerin as needed Heart healthy low-sodium diet and Maintain physical activity  Hyperlipidemia-LDL74 on 01/09/23. High-fiber diet Maintain physical activity Continue aspirin, atorvastatin, omega-3 fatty acids, ezetimibe  Essential hypertension-BP today 134/88. Maintain blood pressure log Continue metoprolol, hydrochlorothiazide  Syncope-denies recent episodes of lightheadedness, presyncope or syncope. Maintain p.o. hydration Change positions slowly May use lower extremity support stockings  Disposition: Follow-up with Dr.Berry or me in 9-12 months.   Kenneth Ripple. Peaches Vanoverbeke NP-C     03/16/2023, 3:40 PM [facility] [address] Office [phone] Fax [phone]    I spent 14 minutes examining this patient, reviewing medications, and using patient centered shared decision making involving her cardiac care.   I spent greater than 20 minutes reviewing her past medical history,  medications, and prior cardiac tests.

## 2023-03-16 ENCOUNTER — Encounter: Payer: Self-pay | Admitting: General Practice

## 2023-03-16 ENCOUNTER — Ambulatory Visit: Payer: PPO | Attending: General Practice | Admitting: General Practice

## 2023-03-16 VITALS — BP 134/88 | HR 65 | Ht 69.0 in | Wt 182.4 lb

## 2023-03-16 DIAGNOSIS — R55 Syncope and collapse: Secondary | ICD-10-CM | POA: Diagnosis not present

## 2023-03-16 DIAGNOSIS — I10 Essential (primary) hypertension: Secondary | ICD-10-CM

## 2023-03-16 DIAGNOSIS — Z951 Presence of aortocoronary bypass graft: Secondary | ICD-10-CM | POA: Diagnosis not present

## 2023-03-16 DIAGNOSIS — E785 Hyperlipidemia, unspecified: Secondary | ICD-10-CM | POA: Diagnosis not present

## 2023-03-16 NOTE — Patient Instructions (Signed)
Medication Instructions:  The current medical regimen is effective;  continue present plan and medications as directed. Please refer to the Current Medication list given to you today.  *If you need a refill on your cardiac medications before your next appointment, please call your pharmacy*  Lab Work: NONE If you have labs (blood work) drawn today and your tests are completely normal, you will receive your results only by:  MyChart Message (if you have MyChart) OR  A paper copy in the mail If you have any lab test that is abnormal or we need to change your treatment, we will call you to review the results.  Other Instructions MAINTAIN PHYSICAL ACTIVITY  Follow-Up: At [facility], you and your health needs are our priority.  As part of our continuing mission to provide you with exceptional heart care, we have created designated Provider Care Teams.  These Care Teams include your primary Cardiologist (physician) and Advanced Practice Providers (APPs -  Physician Assistants and Nurse Practitioners) who all work together to provide you with the care you need, when you need it.  Your next appointment:   12 month(s)  Provider:   Nanetta Batty, MD  or Edd Fabian, FNP

## 2023-04-12 ENCOUNTER — Ambulatory Visit: Payer: PPO | Admitting: Cardiovascular Disease

## 2024-01-14 DIAGNOSIS — E785 Hyperlipidemia, unspecified: Secondary | ICD-10-CM | POA: Diagnosis not present

## 2024-01-14 DIAGNOSIS — I10 Essential (primary) hypertension: Secondary | ICD-10-CM | POA: Diagnosis not present

## 2024-01-14 DIAGNOSIS — Z125 Encounter for screening for malignant neoplasm of prostate: Secondary | ICD-10-CM | POA: Diagnosis not present

## 2024-01-21 DIAGNOSIS — I2510 Atherosclerotic heart disease of native coronary artery without angina pectoris: Secondary | ICD-10-CM | POA: Diagnosis not present

## 2024-01-21 DIAGNOSIS — K219 Gastro-esophageal reflux disease without esophagitis: Secondary | ICD-10-CM | POA: Diagnosis not present

## 2024-01-21 DIAGNOSIS — E785 Hyperlipidemia, unspecified: Secondary | ICD-10-CM | POA: Diagnosis not present

## 2024-01-21 DIAGNOSIS — J302 Other seasonal allergic rhinitis: Secondary | ICD-10-CM | POA: Diagnosis not present

## 2024-01-21 DIAGNOSIS — Z0000 Encounter for general adult medical examination without abnormal findings: Secondary | ICD-10-CM | POA: Diagnosis not present

## 2024-01-21 DIAGNOSIS — Z23 Encounter for immunization: Secondary | ICD-10-CM | POA: Diagnosis not present

## 2024-01-21 DIAGNOSIS — M25562 Pain in left knee: Secondary | ICD-10-CM | POA: Diagnosis not present

## 2024-01-21 DIAGNOSIS — I6523 Occlusion and stenosis of bilateral carotid arteries: Secondary | ICD-10-CM | POA: Diagnosis not present

## 2024-01-21 DIAGNOSIS — N529 Male erectile dysfunction, unspecified: Secondary | ICD-10-CM | POA: Diagnosis not present

## 2024-01-21 DIAGNOSIS — I10 Essential (primary) hypertension: Secondary | ICD-10-CM | POA: Diagnosis not present

## 2024-01-28 ENCOUNTER — Ambulatory Visit: Admitting: Sports Medicine

## 2024-01-31 ENCOUNTER — Ambulatory Visit: Admitting: Sports Medicine

## 2024-01-31 VITALS — BP 138/88 | HR 100 | Ht 69.0 in | Wt 183.0 lb

## 2024-01-31 DIAGNOSIS — M25562 Pain in left knee: Secondary | ICD-10-CM | POA: Diagnosis not present

## 2024-01-31 DIAGNOSIS — G8929 Other chronic pain: Secondary | ICD-10-CM | POA: Diagnosis not present

## 2024-01-31 DIAGNOSIS — M1712 Unilateral primary osteoarthritis, left knee: Secondary | ICD-10-CM | POA: Diagnosis not present

## 2024-01-31 NOTE — Progress Notes (Signed)
 Ben Dennys Guin D.[facility]MENTE[facility] [address] [place] [address] Phone: [phone]   Assessment and Plan:      1. Chronic pain of left knee (Primary) 2. Primary osteoarthritis of left knee -Chronic with exacerbation, initial sports medicine visit - Most consistent with osteoarthritis of left knee with intermittent flares.  Primarily in medial compartment - Patient brought in outside x-ray imaging.  I reviewed x-ray imaging with patient.  My interpretation: Severely decreased medial joint space, moderate degenerative changes in patellofemoral compartment - Use Tylenol 500 to 1000 mg tablets 2-3 times a day for day-to-day pain relief -Recommend limiting p.o. prednisone, NSAID use with cardiac history of CABG, hypertension - Start HEP for knee - Recommend using supportive footwear to decrease impact on knee.  May go to [employer] or other similar store - Discussed intra-articular knee CSI, medial knee offloader brace as potential future options for treatment.  15 additional minutes spent for educating Therapeutic Home Exercise Program.  This included exercises focusing on stretching, strengthening, with focus on eccentric aspects.   Long term goals include an improvement in range of motion, strength, endurance as well as avoiding reinjury. Patient's frequency would include in 1-2 times a day, 3-5 times a week for a duration of 6-12 weeks. Proper technique shown and discussed handout in great detail with ATC.  All questions were discussed and answered.    Pertinent previous records reviewed include 18 printed office notes from outside clinic, x-ray disc with imaging of left knee   Follow Up: 4 to 6 weeks for reevaluation.  If no improvement or worsening of symptoms, could consider CSI versus medial offloader knee brace   Subjective:   I, Moenique Parris, am serving as a Neurosurgeon for Doctor Morene Mace  Chief Complaint: Left knee pain   HPI:    01/31/24 Patient is a 73 year old male with left knee pain. Patient states medial knee pain that radiates to the quad. Has had pain for a while. Hx of ankle pain did not seek treatment. No meds for the pain. Pain when he walks for periods of time. No numbness or tingling. ROM WNL. No swelling or anything. Bracing helped a little but not enough. No knee pain today. No MOI. No pain when going up and down steps    Relevant Historical Information: Hypertension, history of CABG  Additional pertinent review of systems negative.   Current Outpatient Medications:    Ascorbic Acid  (VITAMIN C ) 1000 MG tablet, Take 1,000 mg by mouth daily., Disp: , Rfl:    aspirin  81 MG tablet, Take 81 mg by mouth every evening. , Disp: , Rfl:    atorvastatin  (LIPITOR) 40 MG tablet, Take 40 mg by mouth at bedtime. , Disp: , Rfl:    cetirizine (ZYRTEC ALLERGY) 10 MG tablet, Take 10 mg by mouth daily., Disp: , Rfl:    ezetimibe (ZETIA) 10 MG tablet, Take 10 mg by mouth daily., Disp: , Rfl:    folic acid  (FOLVITE ) 400 MCG tablet, Take 400 mcg by mouth every evening. , Disp: , Rfl:    hydrochlorothiazide (HYDRODIURIL) 12.5 MG tablet, Take 12.5 mg by mouth daily., Disp: , Rfl:    losartan-hydrochlorothiazide (HYZAAR) 50-12.5 MG tablet, Take 1 tablet by mouth daily., Disp: , Rfl:    metoprolol  tartrate (LOPRESSOR ) 50 MG tablet, Take 50 mg by mouth 2 (two) times daily. Take 1.5 Tablets Twice Daily as Directed, Disp: , Rfl:    Multiple Vitamin (MULTIVITAMIN) capsule, Take  1 capsule by mouth daily., Disp: , Rfl:    NITROSTAT 0.4 MG SL tablet, Take 0.4 mg by mouth every 5 (five) minutes as needed for chest pain (take up to 3 times daily). , Disp: , Rfl:    Omega-3 Fatty Acids (FISH OIL ) 600 MG CAPS, Take 2,400 mg by mouth daily. , Disp: , Rfl:    OVER THE COUNTER MEDICATION, every other day. Beet Root, Disp: , Rfl:    pantoprazole  (PROTONIX ) 40 MG tablet, , Disp: , Rfl:    sildenafil (VIAGRA) 50 MG tablet, Take 50 mg by mouth  daily as needed for erectile dysfunction., Disp: , Rfl:    Objective:     Vitals:   01/31/24 0754  BP: 138/88  Pulse: 100  SpO2: 99%  Weight: 183 lb (83 kg)  Height: 5' 9 (1.753 m)      Body mass index is 27.02 kg/m.    Physical Exam:    General:  awake, alert oriented, no acute distress nontoxic Skin: no suspicious lesions or rashes Neuro:sensation intact and strength 5/5 with no deficits, no atrophy, normal muscle tone Psych: No signs of anxiety, depression or other mood disorder  Left knee: No swelling No deformity Neg fluid wave, joint milking ROM Flex 110, Ext 0 TTP medial joint line, medial femoral condyle NTTP over the quad tendon,  lat fem condyle, patella, plica, patella tendon, tibial tuberostiy, fibular head, posterior fossa, pes anserine bursa, gerdy's tubercle,   lateral jt line Neg anterior and posterior drawer Neg lachman Neg sag sign Negative varus stress Negative valgus stress Negative McMurray Negative Thessaly  Gait normal    Electronically signed by:  Odis Mace D.[facility] 8:27 AM 01/31/24

## 2024-01-31 NOTE — Patient Instructions (Signed)
 Knee HEP   Tylenol [phone] mg 2-3 times a day for pain relief   Recommend going to [employer] store to discus comfortable shoes or inserts  4-6 week follow up

## 2024-02-21 NOTE — Progress Notes (Signed)
 [facility] [address] [place] [address] Phone: [phone]   Assessment and Plan:     1. Chronic pain of left knee (Primary) 2. Primary osteoarthritis of left knee -Chronic with exacerbation, subsequent visit - Overall significant improvement in knee pain with transitioning to cushions, Hoka shoes and using Tylenol as needed and starting HEP.  Consistent with improving flare of severe medial compartment osteoarthritis - Use Tylenol 500 to 1000 mg tablets 2-3 times a day for day-to-day pain relief - Continue HEP for knee - Continue to use supportive footwear - Recommend limiting p.o. prednisone, NSAID use with history of CABG, hypertension    Pertinent previous records reviewed include none   Follow Up: As needed if no improvement or worsening of symptoms.  Could consider CSI versus medial offloader knee brace versus PT   Subjective:   I, Moenique Parris, am serving as a neurosurgeon for Doctor Morene Mace   Chief Complaint: Left knee pain    HPI:    01/31/24 Patient is a 73 year old male with left knee pain. Patient states medial knee pain that radiates to the quad. Has had pain for a while. Hx of ankle pain did not seek treatment. No meds for the pain. Pain when he walks for periods of time. No numbness or tingling. ROM WNL. No swelling or anything. Bracing helped a little but not enough. No knee pain today. No MOI. No pain when going up and down steps    02/28/2024 Patient states still has some pain but has gotten a little better. Hokas have helped    Relevant Historical Information: Hypertension, history of CABG  Additional pertinent review of systems negative.   Current Outpatient Medications:    Ascorbic Acid  (VITAMIN C ) 1000 MG tablet, Take 1,000 mg by mouth daily., Disp: , Rfl:    aspirin  81 MG tablet, Take 81 mg by mouth every evening. , Disp: , Rfl:    atorvastatin  (LIPITOR) 40 MG tablet, Take 40 mg by  mouth at bedtime. , Disp: , Rfl:    cetirizine (ZYRTEC ALLERGY) 10 MG tablet, Take 10 mg by mouth daily., Disp: , Rfl:    ezetimibe (ZETIA) 10 MG tablet, Take 10 mg by mouth daily., Disp: , Rfl:    folic acid  (FOLVITE ) 400 MCG tablet, Take 400 mcg by mouth every evening. , Disp: , Rfl:    hydrochlorothiazide (HYDRODIURIL) 12.5 MG tablet, Take 12.5 mg by mouth daily., Disp: , Rfl:    losartan-hydrochlorothiazide (HYZAAR) 50-12.5 MG tablet, Take 1 tablet by mouth daily., Disp: , Rfl:    metoprolol  tartrate (LOPRESSOR ) 50 MG tablet, Take 50 mg by mouth 2 (two) times daily. Take 1.5 Tablets Twice Daily as Directed, Disp: , Rfl:    Multiple Vitamin (MULTIVITAMIN) capsule, Take 1 capsule by mouth daily., Disp: , Rfl:    NITROSTAT 0.4 MG SL tablet, Take 0.4 mg by mouth every 5 (five) minutes as needed for chest pain (take up to 3 times daily). , Disp: , Rfl:    Omega-3 Fatty Acids (FISH OIL ) 600 MG CAPS, Take 2,400 mg by mouth daily. , Disp: , Rfl:    OVER THE COUNTER MEDICATION, every other day. Beet Root, Disp: , Rfl:    pantoprazole  (PROTONIX ) 40 MG tablet, , Disp: , Rfl:    sildenafil (VIAGRA) 50 MG tablet, Take 50 mg by mouth daily as needed for erectile dysfunction., Disp: , Rfl:    Objective:  Vitals:   02/28/24 0837  BP: 122/82  Pulse: 87  SpO2: 96%  Weight: 180 lb (81.6 kg)  Height: 5' 9 (1.753 m)      Body mass index is 26.58 kg/m.    Physical Exam:    General:  awake, alert oriented, no acute distress nontoxic Skin: no suspicious lesions or rashes Neuro:sensation intact and strength 5/5 with no deficits, no atrophy, normal muscle tone Psych: No signs of anxiety, depression or other mood disorder  Left knee: No swelling No deformity Neg fluid wave, joint milking ROM Flex 110, Ext 5 NTTP over the quad tendon, medial fem condyle, lat fem condyle, patella, plica, patella tendon, tibial tuberostiy, fibular head, posterior fossa, pes anserine bursa, gerdy's tubercle, medial  jt line, lateral jt line Neg anterior and posterior drawer Neg lachman Neg sag sign Negative varus stress Negative valgus stress Negative McMurray Negative Thessaly  Gait normal    Electronically signed by:  Odis Mace D.CLEMENT[facility] 8:55 AM 02/28/24

## 2024-02-28 ENCOUNTER — Ambulatory Visit: Admitting: Sports Medicine

## 2024-02-28 VITALS — BP 122/82 | HR 87 | Ht 69.0 in | Wt 180.0 lb

## 2024-02-28 DIAGNOSIS — M1712 Unilateral primary osteoarthritis, left knee: Secondary | ICD-10-CM

## 2024-02-28 DIAGNOSIS — M25562 Pain in left knee: Secondary | ICD-10-CM | POA: Diagnosis not present

## 2024-02-28 DIAGNOSIS — G8929 Other chronic pain: Secondary | ICD-10-CM | POA: Diagnosis not present

## 2024-02-28 NOTE — Patient Instructions (Signed)
Tylenol [phone] mg 2-3 times a day for pain relief  Continue HEP  As needed follow up

## 2024-03-20 ENCOUNTER — Encounter: Payer: Self-pay | Admitting: Cardiovascular Disease

## 2024-03-20 ENCOUNTER — Ambulatory Visit: Attending: Cardiovascular Disease | Admitting: Cardiovascular Disease

## 2024-03-20 VITALS — BP 148/84 | HR 56 | Ht 70.0 in | Wt 184.0 lb

## 2024-03-20 DIAGNOSIS — I2510 Atherosclerotic heart disease of native coronary artery without angina pectoris: Secondary | ICD-10-CM | POA: Diagnosis not present

## 2024-03-20 DIAGNOSIS — I10 Essential (primary) hypertension: Secondary | ICD-10-CM | POA: Diagnosis not present

## 2024-03-20 DIAGNOSIS — R55 Syncope and collapse: Secondary | ICD-10-CM | POA: Diagnosis not present

## 2024-03-20 DIAGNOSIS — Z951 Presence of aortocoronary bypass graft: Secondary | ICD-10-CM | POA: Diagnosis not present

## 2024-03-20 DIAGNOSIS — E785 Hyperlipidemia, unspecified: Secondary | ICD-10-CM

## 2024-03-20 DIAGNOSIS — Z9861 Coronary angioplasty status: Secondary | ICD-10-CM

## 2024-03-20 NOTE — Patient Instructions (Signed)

## 2024-03-20 NOTE — Assessment & Plan Note (Signed)
 History of CAD status post bypass grafting times 24 April 1999 he had native RCA stented by Dr. Herminio 12/05/2004.  This was not a bypass vessel.  He had a normal stress test performed 08/25/2018.  He denies chest pain or shortness of breath.

## 2024-03-20 NOTE — Assessment & Plan Note (Signed)
 History of dyslipidemia on atorvastatin  with lipid profile performed 01/14/2024 revealing total cholesterol 131, LDL 73 and HDL 39.

## 2024-03-20 NOTE — Assessment & Plan Note (Signed)
 History of essential hypertension blood pressure measured today at 148/84.  I reviewed his blood pressure log from home and for the most part his blood pressure readings are within normal limits.  He is on metoprolol , losartan and hydrochlorothiazide.

## 2024-03-20 NOTE — Progress Notes (Signed)
 03/20/2024 Hiep Ollis Fennelly   04-09-1951  [id number]  Primary Physician Clarice Nottingham, MD Primary Cardiologist: Dorn PARAS Lesches MD [facility] [place], [place]  HPI:  Kenneth Sanchez is a 73 y.o.  mildly overweight married Caucasian male, father of 3, grandfather  4 grandchildren,, who works at  at [employer] part-time 2 days a week.. I last saw him in the office 03/06/2022.SABRA He has a history of CAD, status post coronary artery bypass grafting x4 in January 2001. He apparently had an abnormal Bruce-protocol exercise stress test with nonsustained ventricular tachycardia and was catheterized by Dr. Norleen Gent revealing patent grafts with an ungrafted RCA and a high-grade RCA lesion, which was stented by Dr. Lamar Hasten with a drug-eluting stent (2.5 x 13 mm) December 05, 2004. His other problems include hypertension and hyperlipidemia. He does complain of some abdominal discomfort radiating to his back but denies chest pain or shortness of breath. His most recent lab work, performed in May,wwas apparently favorable but we are in the process of obtaining those results. I obtain a Myoview  stress test on him August 2014 which was normal   He had a Myoview  performed 08/25/2018 which was nonischemic.  He continues to be fairly active and is asymptomatic.  Current Meds  Medication Sig   Ascorbic Acid  (VITAMIN C ) 1000 MG tablet Take 1,000 mg by mouth daily.   aspirin  81 MG tablet Take 81 mg by mouth every evening.    atorvastatin  (LIPITOR) 40 MG tablet Take 40 mg by mouth at bedtime.    cetirizine (ZYRTEC ALLERGY) 10 MG tablet Take 10 mg by mouth daily.   ezetimibe (ZETIA) 10 MG tablet Take 10 mg by mouth daily.   folic acid  (FOLVITE ) 400 MCG tablet Take 400 mcg by mouth every evening.    losartan-hydrochlorothiazide (HYZAAR) 50-12.5 MG tablet Take 1 tablet by mouth daily.   metoprolol  tartrate (LOPRESSOR ) 50 MG tablet Take 50 mg by mouth 2 (two) times daily. Take 1.5 Tablets Twice Daily as  Directed   Multiple Vitamin (MULTIVITAMIN) capsule Take 1 capsule by mouth daily.   NITROSTAT 0.4 MG SL tablet Take 0.4 mg by mouth every 5 (five) minutes as needed for chest pain (take up to 3 times daily).    Omega-3 Fatty Acids (FISH OIL ) 600 MG CAPS Take 2,400 mg by mouth daily.    OVER THE COUNTER MEDICATION every other day. Beet Root   pantoprazole  (PROTONIX ) 40 MG tablet    sildenafil (VIAGRA) 50 MG tablet Take 50 mg by mouth daily as needed for erectile dysfunction.   [DISCONTINUED] hydrochlorothiazide (HYDRODIURIL) 12.5 MG tablet Take 12.5 mg by mouth daily.     No Known Allergies  Social History   Socioeconomic History   Marital status: Married    Spouse name: Not on file   Number of children: 3   Years of education: Not on file   Highest education level: Not on file  Occupational History   Occupation: production designer, theatre/television/film    Employer: [employer]  Tobacco Use   Smoking status: Never   Smokeless tobacco: Never  Vaping Use   Vaping status: Never Used  Substance and Sexual Activity   Alcohol use: No   Drug use: No   Sexual activity: Not Currently  Other Topics Concern   Not on file  Social History Narrative   Not on file   Social Drivers of Health   Financial Resource Strain: Not on file  Food Insecurity: Not on  file  Transportation Needs: Not on file  Physical Activity: Not on file  Stress: Not on file  Social Connections: Not on file  Intimate Partner Violence: Not on file     Review of Systems: General: negative for chills, fever, night sweats or weight changes.  Cardiovascular: negative for chest pain, dyspnea on exertion, edema, orthopnea, palpitations, paroxysmal nocturnal dyspnea or shortness of breath Dermatological: negative for rash Respiratory: negative for cough or wheezing Urologic: negative for hematuria Abdominal: negative for nausea, vomiting, diarrhea, bright red blood per rectum, melena, or hematemesis Neurologic: negative for visual  changes, syncope, or dizziness All other systems reviewed and are otherwise negative except as noted above.    Blood pressure (!) 148/84, pulse (!) 56, height 5' 10 (1.778 m), weight 184 lb (83.5 kg), SpO2 98%.  General appearance: alert and no distress Neck: no adenopathy, no carotid bruit, no JVD, supple, symmetrical, trachea midline, and thyroid not enlarged, symmetric, no tenderness/mass/nodules Lungs: clear to auscultation bilaterally Heart: regular rate and rhythm, S1, S2 normal, no murmur, click, rub or gallop Extremities: extremities normal, atraumatic, no cyanosis or edema Pulses: 2+ and symmetric Skin: Skin color, texture, turgor normal. No rashes or lesions Neurologic: Grossly normal  EKG EKG Interpretation Date/Time:  Monday March 20 2024 13:15:39 EST Ventricular Rate:  56 PR Interval:  162 QRS Duration:  102 QT Interval:  456 QTC Calculation: 440 R Axis:   34  Text Interpretation: Sinus bradycardia When compared with ECG of 09-Feb-2023 06:06, No significant change was found Confirmed by Court Carrier [phone]) on 03/20/2024 1:36:31 PM    ASSESSMENT AND PLAN:   Essential hypertension History of essential hypertension blood pressure measured today at 148/84.  I reviewed his blood pressure log from home and for the most part his blood pressure readings are within normal limits.  He is on metoprolol , losartan and hydrochlorothiazide.  Dyslipidemia, goal LDL below 70 History of dyslipidemia on atorvastatin  with lipid profile performed 01/14/2024 revealing total cholesterol 131, LDL 73 and HDL 39.  Hx of CABG History of CAD status post bypass grafting times 24 April 1999 he had native RCA stented by Dr. Herminio 12/05/2004.  This was not a bypass vessel.  He had a normal stress test performed 08/25/2018.  He denies chest pain or shortness of breath.     Carrier DOROTHA Court MD FACP,FACC,FAHA, [facility] 03/20/2024 1:45 PM
